# Patient Record
Sex: Female | Born: 1990 | Race: Black or African American | Hispanic: No | Marital: Single | Smoking: Never smoker
Health system: Southern US, Community
[De-identification: ages and names within clinical notes are randomized; demographics above are authoritative.]

## PROBLEM LIST (undated history)

## (undated) DIAGNOSIS — C499 Malignant neoplasm of connective and soft tissue, unspecified: Secondary | ICD-10-CM

## (undated) DIAGNOSIS — G8929 Other chronic pain: Secondary | ICD-10-CM

## (undated) DIAGNOSIS — I82401 Acute embolism and thrombosis of unspecified deep veins of right lower extremity: Secondary | ICD-10-CM

## (undated) DIAGNOSIS — M84421A Pathological fracture, right humerus, initial encounter for fracture: Secondary | ICD-10-CM

## (undated) DIAGNOSIS — C7989 Secondary malignant neoplasm of other specified sites: Secondary | ICD-10-CM

## (undated) DIAGNOSIS — G893 Neoplasm related pain (acute) (chronic): Secondary | ICD-10-CM

## (undated) DIAGNOSIS — M549 Dorsalgia, unspecified: Secondary | ICD-10-CM

## (undated) DIAGNOSIS — M84451A Pathological fracture, right femur, initial encounter for fracture: Secondary | ICD-10-CM

## (undated) DIAGNOSIS — G8191 Hemiplegia, unspecified affecting right dominant side: Secondary | ICD-10-CM

## (undated) DIAGNOSIS — M25552 Pain in left hip: Secondary | ICD-10-CM

## (undated) DIAGNOSIS — R000 Tachycardia, unspecified: Secondary | ICD-10-CM

## (undated) HISTORY — PX: HIP SURGERY: SHX245

## (undated) HISTORY — PX: PORTACATH PLACEMENT: SHX2246

## (undated) HISTORY — PX: PAIN PUMP IMPLANTATION: SHX330

---
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
## 2011-04-15 LAB — DIFFERENTIAL
Basophils Absolute: 0
Lymphs Abs: 1.5
Monocytes Relative: 3
Neutrophils Relative %: 9 — ABNORMAL LOW

## 2011-04-15 LAB — BASIC METABOLIC PANEL
BUN: 13
Calcium: 9.3
Creatinine, Ser: 0.87
Glucose, Bld: 90

## 2011-04-15 LAB — URINALYSIS, ROUTINE W REFLEX MICROSCOPIC
Hgb urine dipstick: NEGATIVE
Nitrite: NEGATIVE
Specific Gravity, Urine: 1.01
Urobilinogen, UA: 0.2

## 2011-04-15 LAB — CULTURE, BLOOD (ROUTINE X 2)
Culture: NO GROWTH
Culture: NO GROWTH
Report Status: 2272009

## 2011-04-15 LAB — CBC
HCT: 27.5 — ABNORMAL LOW
Platelets: 457 — ABNORMAL HIGH
RDW: 14.2

## 2011-04-15 LAB — URINE CULTURE

## 2011-04-19 LAB — CBC
HCT: 32.1 — ABNORMAL LOW
MCV: 101.5 — ABNORMAL HIGH
Platelets: 416 — ABNORMAL HIGH
RDW: 18.6 — ABNORMAL HIGH

## 2011-04-19 LAB — URINALYSIS, ROUTINE W REFLEX MICROSCOPIC
Glucose, UA: NEGATIVE
Hgb urine dipstick: NEGATIVE
Specific Gravity, Urine: 1.015
pH: 6

## 2011-04-19 LAB — DIFFERENTIAL
Basophils Relative: 0
Eosinophils Relative: 1
Lymphocytes Relative: 37
Neutro Abs: 4.4
Neutrophils Relative %: 59

## 2011-04-21 LAB — DIFFERENTIAL
Basophils Absolute: 0.1
Eosinophils Relative: 1
Lymphocytes Relative: 50

## 2011-04-21 LAB — COMPREHENSIVE METABOLIC PANEL
AST: 19
BUN: 9
CO2: 25
Chloride: 109
Creatinine, Ser: 0.86
Glucose, Bld: 94
Total Bilirubin: 0.6

## 2011-04-21 LAB — CBC
HCT: 33.6
Hemoglobin: 11.5
MCV: 99.4 — ABNORMAL HIGH
RBC: 3.38 — ABNORMAL LOW
WBC: 9.6

## 2011-04-25 LAB — URINALYSIS, ROUTINE W REFLEX MICROSCOPIC
Glucose, UA: NEGATIVE
Specific Gravity, Urine: 1.02

## 2011-04-25 LAB — BASIC METABOLIC PANEL
Calcium: 10.3
Chloride: 102
Creatinine, Ser: 0.87

## 2011-04-25 LAB — URINE MICROSCOPIC-ADD ON

## 2011-04-25 LAB — DIFFERENTIAL
Lymphocytes Relative: 38
Lymphs Abs: 2
Monocytes Relative: 7
Neutro Abs: 2.8
Neutrophils Relative %: 53

## 2011-04-25 LAB — CBC
MCV: 97.3
RBC: 3.7 — ABNORMAL LOW
WBC: 5.2

## 2011-05-05 LAB — DIFFERENTIAL
Basophils Absolute: 0
Eosinophils Relative: 2
Monocytes Absolute: 0.6
Monocytes Relative: 7
Neutrophils Relative %: 54

## 2011-05-05 LAB — CBC
MCV: 92.6 — ABNORMAL HIGH
Platelets: 321
WBC: 8.5

## 2011-07-11 ENCOUNTER — Ambulatory Visit (HOSPITAL_COMMUNITY)
Admission: RE | Admit: 2011-07-11 | Discharge: 2011-07-11 | Disposition: A | Discharge status: Home / Self Care | Payer: Medicaid Other | Source: Ambulatory Visit | Attending: Orthopedic Surgery | Admitting: Orthopedic Surgery

## 2011-07-11 DIAGNOSIS — IMO0001 Reserved for inherently not codable concepts without codable children: Secondary | ICD-10-CM | POA: Insufficient documentation

## 2011-07-11 DIAGNOSIS — R262 Difficulty in walking, not elsewhere classified: Secondary | ICD-10-CM | POA: Insufficient documentation

## 2011-07-11 DIAGNOSIS — M25569 Pain in unspecified knee: Secondary | ICD-10-CM | POA: Insufficient documentation

## 2011-07-11 DIAGNOSIS — R29898 Other symptoms and signs involving the musculoskeletal system: Secondary | ICD-10-CM | POA: Insufficient documentation

## 2011-07-11 DIAGNOSIS — M6281 Muscle weakness (generalized): Secondary | ICD-10-CM | POA: Insufficient documentation

## 2011-07-11 NOTE — Progress Notes (Signed)
Physical Therapy Evaluation  Patient Details  Name: Stacey Murphy MRN: [id number] Date of Birth: Jul 25, 1991  Today's Date: 07/11/2011 Time: 5621-3086 Time Calculation (min): 37 min Visit#: 1  of 8   Re-eval: 08/10/11 Assessment Diagnosis: R iliac wing mass Next MD Visit: Jan 2nd  Prior Therapy: yrs. ago.  Past Medical History: No past medical history on file. Past Surgical History: No past surgical history on file.  Subjective Symptoms/Limitations Symptoms: [name] states that she has a mass on her R hip for 8 yrs.  The mass has always bothered her but it is getting bigger and is affecting her strength therefore she has been referred to physical therapy.   How long can you sit comfortably?: The patient states that she starts having increased pain after ten to fifteen minutes. How long can you stand comfortably?: The patient states that she can stand for twenty minutes before it starts bothering her. How long can you walk comfortably?: The patient states thtat she is able to walk for an hour before she has to quit. Pain Assessment Currently in Pain?: No/denies Pain Score:  (worst pain is a 7-8, pain in knee.) Pain Location: Knee Pain Orientation: Right;Medial Pain Type: Chronic pain Pain Radiating Towards: occasionally to ankle Pain Onset: More than a month ago Pain Frequency: Intermittent Pain Relieving Factors: pain medication   Precautions/Restrictions     Prior Functioning  Home Living Lives With: Family  Sensation/Coordination/Flexibility    Assessment RLE Strength Right Hip Flexion: 2+/5 Right Hip Extension: 2-/5 Right Hip ABduction: 2-/5 Right Hip ADduction: 3/5 Right Knee Flexion: 3+/5 Right Knee Extension: 3+/5 Right Ankle Dorsiflexion: 5/5 Right Ankle Plantar Flexion: 4/5 Right Ankle Inversion: 4/5 Right Ankle Eversion: 4/5 LLE Strength Left Hip Flexion: 4/5 Left Hip Extension: 2/5 Left Hip ABduction: 3-/5 Left Hip ADduction: 5/5 Left Knee  Flexion: 5/5 Left Knee Extension: 5/5 Left Ankle Dorsiflexion: 4/5 Left Ankle Plantar Flexion: 4/5 Left Ankle Inversion: 5/5 Left Ankle Eversion: 5/5  Exercise/Treatments   Standing Heel Raises: 10 reps  R Hip ext and ab x 10 Seated Long Arc Quad: 10 reps Supine R Straight Leg Raises Limitations: knee bent to 30 degrees. Sidelying Hip ABduction: Strengthening;Left;5 reps Hip ADduction: Both;5 sets Prone  Hamstring Curl R 5 reps with 2# Hip Extension: Left;5 reps;Limitation knee flexed to 90 degrees   Physical Therapy Assessment and Plan PT Assessment and Plan Clinical Impression Statement: Pt with inoperable mass on R iliac wing with complaint of medical leg pain.  Pt will benefit from skilled PT to improve functional ability and decrease pain. Rehab Potential: Good PT Frequency: Min 2X/week PT Duration: 4 weeks PT Treatment/Interventions: Gait training;Therapeutic exercise;Balance training PT Plan: Begin lateral step up, forward step up, rockerboard, cybex leg extension, next treatment.    Goals Home Exercise Program Pt will Perform Home Exercise Program: Independently PT Short Term Goals Time to Complete Short Term Goals: 2 weeks PT Short Term Goal 1: Pt pain to be no greater than a 5 PT Short Term Goal 2: Pt to be able to stand for 30 min to make a meal. PT Short Term Goal 3: strength increased 1/2 grade to allow decreased pain. PT Long Term Goals Time to Complete Long Term Goals: 4 weeks PT Long Term Goal 1: Strength to be increased one grade to allow pain to decrease to no greater tnan a 3 PT Long Term Goal 2: Pt to be able to stand for 45 min. Long Term Goal 3: able to walk for  an hour an a half. Long Term Goal 4: able to come sit to stand with no difficulty.  Problem List Patient Active Problem List  Diagnoses  . Bilateral leg weakness  . Difficulty in walking    PT - End of Session Activity Tolerance: Patient tolerated treatment well General Behavior  During Session: [facility] for tasks performed Cognition: [facility] for tasks performed   Uva Runkel,CINDY 07/11/2011, 4:43 PM  Physician Documentation Your signature is required to indicate approval of the treatment plan as stated above.  Please sign and either send electronically or make a copy of this report for your files and return this physician signed original.   Please mark one 1.__approve of plan  2. ___approve of plan with the following conditions.   ______________________________                                                          _____________________ Physician Signature                                                                                                             Date

## 2011-07-11 NOTE — Patient Instructions (Addendum)
HEP

## 2011-07-13 ENCOUNTER — Ambulatory Visit (HOSPITAL_COMMUNITY): Payer: Self-pay | Admitting: Physical Therapy

## 2011-07-13 ENCOUNTER — Telehealth (HOSPITAL_COMMUNITY): Payer: Self-pay

## 2011-07-14 ENCOUNTER — Ambulatory Visit (HOSPITAL_COMMUNITY)
Admission: RE | Admit: 2011-07-14 | Discharge: 2011-07-14 | Disposition: A | Discharge status: Home / Self Care | Payer: Medicaid Other | Source: Ambulatory Visit | Attending: Orthopedic Surgery | Admitting: Orthopedic Surgery

## 2011-07-14 NOTE — Progress Notes (Signed)
Physical Therapy Treatment Patient Details  Name: Stacey Murphy MRN: [id number] Date of Birth: 1991-01-30  Today's Date: 07/14/2011 Time: 1020-1103 Time Calculation (min): 43 min Visit#: 2  of 8   Re-eval: 08/10/11 Charges: Therex x 39'  Subjective: Symptoms/Limitations Symptoms: Pt reports that her exercises at home are going well. Pain Assessment Currently in Pain?: No/denies Pain Score: 0-No pain   Exercise/Treatments Machines for Strengthening Cybex Leg Press: 1Pl 2x10 Standing Heel Raises: 15 reps Lateral Step Up: 10 reps;Right Forward Step Up: 10 reps Rocker Board: 2 minutes;Limitations Rocker Board Limitations: Hand assist as needed Other Standing Knee Exercises: R Hip ext and ab x 10 Seated Long Arc Quad: 10 reps Supine Straight Leg Raises: 5 reps;Limitations Straight Leg Raises Limitations: knee bent to 30 degrees. Sidelying Hip ABduction: 5 sets;Right;AAROM Hip ADduction: 10 reps;Right;Strengthening Prone  Hamstring Curl: 10 reps Hamstring Curl Limitations: 2# Hip Extension: Left;5 reps;Limitations Hip Extension Limitations: knee flexed to 45 degrees   Physical Therapy Assessment and Plan PT Assessment and Plan Clinical Impression Statement: Pt displays weakness and R LE instability with therex. Pt requires multimodal cueing to improve wt shift to R side. Pt reprots no increase in pain at end of session but reports some soreness for exercises. PT Plan: Continue to progress per PT POC.     Problem List Patient Active Problem List  Diagnoses  . Bilateral leg weakness  . Difficulty in walking    PT - End of Session Activity Tolerance: Patient tolerated treatment well General Behavior During Session: [facility] for tasks performed Cognition: [facility] for tasks performed  Antonieta Iba 07/14/2011, 11:04 AM

## 2011-07-21 ENCOUNTER — Ambulatory Visit (HOSPITAL_COMMUNITY): Payer: Self-pay | Admitting: *Deleted

## 2011-07-22 ENCOUNTER — Ambulatory Visit (HOSPITAL_COMMUNITY)
Admission: RE | Admit: 2011-07-22 | Discharge: 2011-07-22 | Disposition: A | Discharge status: Home / Self Care | Payer: Medicaid Other | Source: Ambulatory Visit | Attending: Orthopedic Surgery | Admitting: Orthopedic Surgery

## 2011-07-22 DIAGNOSIS — R29898 Other symptoms and signs involving the musculoskeletal system: Secondary | ICD-10-CM

## 2011-07-22 DIAGNOSIS — R262 Difficulty in walking, not elsewhere classified: Secondary | ICD-10-CM

## 2011-07-22 NOTE — Progress Notes (Signed)
Physical Therapy Treatment Patient Details  Name: Stacey Murphy MRN: [id number] Date of Birth: 09/07/1990  Today's Date: 07/22/2011 Time: 10:40-11:10 30 mins Charges: 30 TE Visit#: 3 of 8 Re-eval: 08/09/10    Subjective: "it's alright" re: her knee. Pt >20 mins late. Thought her appointment was at 10:30 2/10 pain R medial knee  Exercise/Treatments Machines for Strengthening Cybex Leg Press: 1Pl 1 x 10, 1.5 PL 1x10 Standing Heel Raises: 15 reps Lateral Step Up: 10 reps;Right Forward Step Up: 10 reps Rocker Board: 2 minutes;Limitations Rocker Board Limitations: Hand assist as needed Seated Long Arc Quad: 10 reps Sidelying Hip ABduction: Limitations Hip ABduction Limitations: 5 reps Hip ADduction: 10 reps;Right;Strengthening  Physical Therapy Assessment and Plan Clinical Impression statement: Patient reported some pain today with increased weight on leg press (0.5 plates) and with SLR. Pain subsided after exercise was completed.  Plan of Care: Continue with current POC, monitor for pain during exercise. May need to adjust leg press back down to one plate if she complains of pain with 1.5 plates next session.   Problem List Patient Active Problem List  Diagnoses  . Bilateral leg weakness  . Difficulty in walking   Cherie Lasalle B. Jailin Manocchio, PT, DPT  07/22/2011, 5:29 PM

## 2011-07-27 ENCOUNTER — Ambulatory Visit (HOSPITAL_COMMUNITY): Payer: Self-pay | Admitting: *Deleted

## 2011-07-28 ENCOUNTER — Ambulatory Visit (HOSPITAL_COMMUNITY): Payer: Self-pay | Admitting: *Deleted

## 2011-08-01 ENCOUNTER — Ambulatory Visit (HOSPITAL_COMMUNITY): Payer: Self-pay | Admitting: *Deleted

## 2011-08-02 ENCOUNTER — Ambulatory Visit (HOSPITAL_COMMUNITY): Payer: Self-pay

## 2011-08-03 ENCOUNTER — Ambulatory Visit (HOSPITAL_COMMUNITY): Payer: Self-pay | Admitting: *Deleted

## 2011-08-15 ENCOUNTER — Ambulatory Visit (HOSPITAL_COMMUNITY)
Admission: RE | Admit: 2011-08-15 | Discharge: 2011-08-15 | Disposition: A | Discharge status: Home / Self Care | Payer: Medicaid Other | Source: Ambulatory Visit | Attending: Orthopedic Surgery | Admitting: Orthopedic Surgery

## 2011-08-15 DIAGNOSIS — M6281 Muscle weakness (generalized): Secondary | ICD-10-CM | POA: Insufficient documentation

## 2011-08-15 DIAGNOSIS — IMO0001 Reserved for inherently not codable concepts without codable children: Secondary | ICD-10-CM | POA: Insufficient documentation

## 2011-08-15 DIAGNOSIS — M25569 Pain in unspecified knee: Secondary | ICD-10-CM | POA: Insufficient documentation

## 2011-08-15 DIAGNOSIS — R262 Difficulty in walking, not elsewhere classified: Secondary | ICD-10-CM | POA: Insufficient documentation

## 2011-08-15 DIAGNOSIS — R29898 Other symptoms and signs involving the musculoskeletal system: Secondary | ICD-10-CM

## 2011-08-15 NOTE — Progress Notes (Signed)
Physical Therapy Treatment Patient Details  Name: SHABANA ARMENTROUT MRN: [id number] Date of Birth: 1991/04/26  Today's Date: 08/15/2011 Time: 1020-1055 Time Calculation (min): 35 min Visit#: 4  of 8   Re-eval:      Subjective: Symptoms/Limitations Symptoms: Pt states she did not come last week do to back pain. States that her leg is not the main problem at this time.  Pt ambulates into the department with a significant antalgic gait.  Therapist questioned pt if she had a cane.  Pt does.  Gt training with cane showed a significant decrase in the pt limp although she states the pain is only slightly decreased by the use of the cane.  The patient verbalized that she does not want to use the cane.  The therapist explained the importance of using the cane and how walking with such a significant limp is going to increase her level of pain.  Pt very tender to palpation along scar area where pain pump was inserted.  Pt has an appointment with her MD ont the 24th.  Explained to patient that the pain pump needed to be looked at; it should not be painful.     Exercise/Treatments   Supine Bridges: 10 reps Other Supine Knee Exercises: ab set; pelvic tilt, trunk rotation, all x 10 rep. Other Supine Knee Exercises: bent knee raise x 10 with pt unable to raise right LE    Manual Therapy Manual Therapy: Massage Massage: no mm spasm palpatable but pt very tender to palpation along R lateral aspect of scar area.  Physical Therapy Assessment and Plan PT Assessment and Plan Clinical Impression Statement: Pt will need to be cleared from her MD before returning to make sure her pain pump is still intact. PT Frequency: Min 2X/week PT Duration: 4 weeks    Goals    Problem List Patient Active Problem List  Diagnoses  . Bilateral leg weakness  . Difficulty in walking    PT - End of Session Activity Tolerance: Patient limited by pain General Behavior During Session: [facility] for tasks  performed Cognition: [facility] for tasks performed  RUSSELL,CINDY 08/15/2011, 11:05 AM

## 2011-08-15 NOTE — Patient Instructions (Signed)
Instructed to use cane until she is able to ambulate without the antalgic gait.

## 2011-08-19 ENCOUNTER — Encounter (HOSPITAL_COMMUNITY): Payer: Self-pay | Admitting: *Deleted

## 2011-08-19 ENCOUNTER — Emergency Department (HOSPITAL_COMMUNITY): Payer: Medicaid Other

## 2011-08-19 ENCOUNTER — Emergency Department (HOSPITAL_COMMUNITY)
Admission: EM | Admit: 2011-08-19 | Discharge: 2011-08-19 | Disposition: A | Discharge status: Home / Self Care | Payer: Medicaid Other | Attending: Emergency Medicine | Admitting: Emergency Medicine

## 2011-08-19 DIAGNOSIS — C419 Malignant neoplasm of bone and articular cartilage, unspecified: Secondary | ICD-10-CM | POA: Insufficient documentation

## 2011-08-19 DIAGNOSIS — M8448XA Pathological fracture, other site, initial encounter for fracture: Secondary | ICD-10-CM

## 2011-08-19 DIAGNOSIS — C7952 Secondary malignant neoplasm of bone marrow: Secondary | ICD-10-CM | POA: Insufficient documentation

## 2011-08-19 DIAGNOSIS — C499 Malignant neoplasm of connective and soft tissue, unspecified: Secondary | ICD-10-CM

## 2011-08-19 DIAGNOSIS — C7989 Secondary malignant neoplasm of other specified sites: Secondary | ICD-10-CM

## 2011-08-19 DIAGNOSIS — C7800 Secondary malignant neoplasm of unspecified lung: Secondary | ICD-10-CM | POA: Insufficient documentation

## 2011-08-19 DIAGNOSIS — C7951 Secondary malignant neoplasm of bone: Secondary | ICD-10-CM | POA: Insufficient documentation

## 2011-08-19 DIAGNOSIS — W19XXXA Unspecified fall, initial encounter: Secondary | ICD-10-CM | POA: Insufficient documentation

## 2011-08-19 HISTORY — DX: Secondary malignant neoplasm of other specified sites: C79.89

## 2011-08-19 HISTORY — DX: Malignant neoplasm of connective and soft tissue, unspecified: C49.9

## 2011-08-19 LAB — URINALYSIS, ROUTINE W REFLEX MICROSCOPIC
Glucose, UA: NEGATIVE mg/dL
Leukocytes, UA: NEGATIVE
Nitrite: NEGATIVE
Protein, ur: NEGATIVE mg/dL

## 2011-08-19 LAB — PREGNANCY, URINE: Preg Test, Ur: NEGATIVE

## 2011-08-19 MED ORDER — HYDROMORPHONE HCL PF 1 MG/ML IJ SOLN
1.0000 mg | Freq: Once | INTRAMUSCULAR | Status: AC
  Administered 2011-08-19: 1 mg via INTRAMUSCULAR
  Filled 2011-08-19: qty 1

## 2011-08-19 MED ORDER — DIAZEPAM 5 MG PO TABS
5.0000 mg | ORAL_TABLET | Freq: Once | ORAL | Status: AC
  Administered 2011-08-19: 5 mg via ORAL
  Filled 2011-08-19: qty 1

## 2011-08-19 MED ORDER — ONDANSETRON HCL 4 MG PO TABS
4.0000 mg | ORAL_TABLET | Freq: Once | ORAL | Status: AC
  Administered 2011-08-19: 4 mg via ORAL
  Filled 2011-08-19: qty 1

## 2011-08-19 MED ORDER — OXYCODONE-ACETAMINOPHEN 10-325 MG PO TABS: 1.0000 | ORAL_TABLET | Freq: Four times a day (QID) | ORAL | Status: AC | PRN

## 2011-08-19 MED ORDER — OXYCODONE-ACETAMINOPHEN 5-325 MG PO TABS
1.0000 | ORAL_TABLET | Freq: Once | ORAL | Status: AC
  Administered 2011-08-19: 1 via ORAL
  Filled 2011-08-19: qty 1

## 2011-08-19 NOTE — ED Notes (Signed)
Pt fell last night, states landed on knees.  C/o low back pain, worse on right side shooting down right leg.  Pt has hx of cancer.

## 2011-08-19 NOTE — ED Provider Notes (Addendum)
2:35 PM-Diane Hanel Larena Glassman, MD to bedside to evaluate.  CC: back pain  Stacey Murphy is a 21 y.o. female who presents to the Emergency Department complaining of constant moderate to severe back pain radiating to lower extremities. 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 Currently being evaluated at [facility]. No longer on chemo or radiation at this time. Denies new lower extremity weakness or numbness. No fever or chills. No urinary or fecal incontinence. Pain worsened by movement and sitting forward. She reports she fell last night onto her bilateral knees. Denies knee pain at this time. Pain is constant. Nothing improves her pain. Her pain is moderate to severe  PMHx: osteosarcoma Meds: oxycontin. Percocet. Megace, celebrex Allergies: None Social Hx: no drug use, denies ETOH and tobacco use Family hx: noncontributory Surgical Hx: pain pump insertion  Vitals: HR 102 BP 103/50  R18 Pox 99%RA Gen: uncomfortable Head: atraumatic HEENT: eyes normal. Airway patent Cardiac: RRR Pulm: Clear bilaterally, no rhonchi Abd: soft nontender Ext: normal strength in bilateral lower and upper extremities. Sensation grossly intact in lower extremities Back: no stepoffs, no spasms. Mild tenderness of lower thoracic and lumbar spine without focal tenderness  Dg Lumbar Spine Complete  08/19/2011  *RADIOLOGY REPORT*  Clinical Data: Metastatic sarcoma, back pain, fell 2 days ago  LUMBAR SPINE - COMPLETE 4+ VIEW  Comparison: None Correlation:  CT abdomen pelvis 05/08/2010  Findings: Five non-rib bearing lumbar vertebrae. Mild broad-based levoconvex lumbar scoliosis. Infusion pump left lower quadrant obscures the spine on the RPO view. Questionable height loss at the inferior endplate of T12 on the left. Findings are suspicious for a subtle inferior endplate compression fracture. The appearance of the T12 vertebral body is different versus the prior CT from 2011. No additional  fracture, subluxation or bone destruction. Destruction of right ilium identified. No other focal bony abnormalities identified. Right pelvic calcification versus phlebolith stable.  IMPRESSION: Questionable subtle inferior endplate compression fracture at T12 on left. If clinically indicated this could be assessed by limited CT. Levoconvex scoliosis. Right iliac destruction consistent with known tumor.  Original Report Authenticated By: Lollie Marrow, M.D.   Dg Pelvis 1-2 Views  08/19/2011  *RADIOLOGY REPORT*  Clinical Data: Pain post fall, history of sarcoma  PELVIS - 1-2 VIEW  Comparison: Abdominal radiograph 08/02/2010  Findings: Large area bone destruction involving the right ileum compatible with known tumor, significantly increased since 2012. Hip joint spaces symmetric and preserved. Remaining osseous mineralization normal. No acute fracture or dislocation identified.  IMPRESSION: Large area bone destruction involving the right ileum compatible with known tumor. No definite acute bony abnormalities.  Original Report Authenticated By: Lollie Marrow, M.D.   Ct T Spine Ltd Wo Or W/ Cm  08/19/2011  *RADIOLOGY REPORT*  Clinical Data: Back pain after fall.   History of metastatic sarcoma.  CT THORACIC SPINE WITHOUT CONTRAST  Technique:  Multidetector CT imaging of the thoracic spine was performed without intravenous contrast administration. Multiplanar CT image reconstructions were also generated  Comparison: 08/19/2011 lumbar spine plain film exam.   05/25/2010 CT chest.  Findings: The present examination incorporates from mid T10 to the L1-2 disc space.  Right posterior sulcus 2.3 x 1.6 cm mass has increase in size since prior CT exam when this measured 1.3 x 1.1 cm.  New destructive lesion of the T12 vertebral body involving majority of the T12 vertebra with extension through the anterior and posterior cortex with epidural extension with  very mild impression upon the ventricular peritoneal shunt and cord.   15% loss of height of the anterior superior T12 endplate.  MR imaging would prove more sensitive for delineation of epidural disease if clinically desired.  IMPRESSION: T12 metastatic disease with epidural extension as detailed above.  Increase in size of right lung base nodule.  Critical Value/emergent results were called by telephone at the time of interpretation on 08/19/2011  at 2:25 p.m.  to  Dr. Patria Mane, who verbally acknowledged these results.  Original Report Authenticated By: Fuller Canada, M.D.    I personally reviewed the CT scan and discussed the findings with radiology  Results for orders placed during the hospital encounter of 08/19/11  URINALYSIS, ROUTINE W REFLEX MICROSCOPIC      Component Value Range   Color, Urine YELLOW  YELLOW    APPearance HAZY (*) CLEAR    Specific Gravity, Urine 1.025  1.005 - 1.030    pH 7.0  5.0 - 8.0    Glucose, UA NEGATIVE  NEGATIVE (mg/dL)   Hgb urine dipstick NEGATIVE  NEGATIVE    Bilirubin Urine NEGATIVE  NEGATIVE    Ketones, ur NEGATIVE  NEGATIVE (mg/dL)   Protein, ur NEGATIVE  NEGATIVE (mg/dL)   Urobilinogen, UA 0.2  0.0 - 1.0 (mg/dL)   Nitrite NEGATIVE  NEGATIVE    Leukocytes, UA NEGATIVE  NEGATIVE   PREGNANCY, URINE      Component Value Range   Preg Test, Ur NEGATIVE  NEGATIVE    MDM Patient informed of current lab and imaging results which show metastatic disease to T12 and associated damage to T12 vertebrae.   Patient and family member advised of need for follow up with Oncologist/PCP. Patient notes pain has not improved at this time. Will administer additional pain medication.  I personally performed the services described in this documentation, which was scribed in my presence. The recorded information has been reviewed and considered.    I discussed the findings of the CT scan with the radiologist.  Sezary on MS Contin and oxycodone.  Will add the additional oxycodone with acetaminophen.  Close followup with her oncologist.  She  really has appointment scheduled for February 6.  We have requested that the patient call for sooner followup.  She's been given a copy of her CT scan on a disk to with her to her oncology appointment  Lyanne Co, MD 08/19/11 1457  Lyanne Co, MD 08/19/11 0272  Lyanne Co, MD 08/19/11 2157

## 2011-08-19 NOTE — ED Notes (Signed)
Pt back from CT

## 2011-08-22 ENCOUNTER — Emergency Department (HOSPITAL_COMMUNITY)
Admission: EM | Admit: 2011-08-22 | Discharge: 2011-08-22 | Disposition: A | Discharge status: Other Acute Inpatient Hospital | Payer: Medicaid Other | Attending: Emergency Medicine | Admitting: Emergency Medicine

## 2011-08-22 ENCOUNTER — Encounter (HOSPITAL_COMMUNITY): Payer: Self-pay

## 2011-08-22 DIAGNOSIS — M549 Dorsalgia, unspecified: Secondary | ICD-10-CM | POA: Insufficient documentation

## 2011-08-22 DIAGNOSIS — C499 Malignant neoplasm of connective and soft tissue, unspecified: Secondary | ICD-10-CM | POA: Insufficient documentation

## 2011-08-22 DIAGNOSIS — C7951 Secondary malignant neoplasm of bone: Secondary | ICD-10-CM

## 2011-08-22 MED ORDER — SODIUM CHLORIDE 0.9 % IV SOLN
INTRAVENOUS | Status: DC
  Administered 2011-08-22: 16:00:00 via INTRAVENOUS

## 2011-08-22 MED ORDER — FENTANYL CITRATE 0.05 MG/ML IJ SOLN
100.0000 ug | INTRAMUSCULAR | Status: DC | PRN
  Administered 2011-08-22 (×2): 100 ug via INTRAVENOUS
  Filled 2011-08-22 (×2): qty 2

## 2011-08-22 NOTE — ED Notes (Signed)
Pt c/o left lower back pain since Friday.  Reports her oncologist told her to come to ed for evaluation and possible transfer to [facility].  Says doctors name is Dr. Christin Bach.

## 2011-08-22 NOTE — ED Provider Notes (Cosign Needed)
History     CSN: [id number]  Arrival date & time 08/22/11  1149   First MD Initiated Contact with Patient 08/22/11 1502      Chief Complaint  Patient presents with  . Back Pain    (Consider location/radiation/quality/duration/timing/severity/associated sxs/prior treatment) HPI Stacey Murphy is a 21 y.o. female presents with c/o back pain leading to desire to be assessed in the ED. The sx(s) have been present for several weeks. Additional concerns are the pain is worsening and not responding to her pain medicines. Causative factors are metastatic sarcoma. Palliative factors are nothing helps. The distress associated is moderate. The disorder has been present for several weeks. She was seen here 3 days ago had a CT of the lumbar spine that showed a new T12 lesion, consistent with metastases. There is no significant epidural compromise seen on the CT. The patient has no symptoms of cauda equina syndrome. Her pain is right lumbar extending to right. She denies urinary incontinence, urinary retention, fecal incontinence, and fecal retention. She has no paresthesia.     Past Medical History  Diagnosis Date  . Metastatic sarcoma     Past Surgical History  Procedure Date  . Portacath placement   . Pain pump implantation     No family history on file.  History  Substance Use Topics  . Smoking status: Never Smoker   . Smokeless tobacco: Not on file  . Alcohol Use: No    OB History    Grav Para Term Preterm Abortions TAB SAB Ect Mult Living                  Review of Systems  All other systems reviewed and are negative.    Allergies  Pork-derived products  Home Medications   Current Outpatient Rx  Name Route Sig Dispense Refill  . ALBUTEROL SULFATE HFA 108 (90 BASE) MCG/ACT IN AERS Inhalation Inhale 2 puffs into the lungs every 6 (six) hours as needed. For shortness of breath/cough    . CELECOXIB 100 MG PO CAPS Oral Take 100 mg by mouth daily.    . MEGESTROL  ACETATE 40 MG/ML PO SUSP Oral Take 200 mg by mouth 2 (two) times daily.     . MUSCLE RUB 10-15 % EX CREA Topical Apply 1 application topically as needed. For muscle pain reilef    . OXYCODONE HCL 5 MG PO TABS Oral Take 5 mg by mouth every 6 (six) hours as needed. pain    . OXYCODONE-ACETAMINOPHEN 10-325 MG PO TABS Oral Take 1 tablet by mouth every 6 (six) hours as needed for pain. 30 tablet 0    BP 128/63  Pulse 122  Temp(Src) 98.6 F (37 C) (Oral)  Resp 16  Ht 4\' 7"  (1.397 m)  Wt 106 lb (48.081 kg)  BMI 24.64 kg/m2  SpO2 100%  Physical Exam  Nursing note and vitals reviewed. Constitutional: She is oriented to person, place, and time. She appears well-developed and well-nourished.  HENT:  Head: Normocephalic and atraumatic.  Eyes: Conjunctivae and EOM are normal. Pupils are equal, round, and reactive to light.  Neck: Normal range of motion and phonation normal. Neck supple.  Cardiovascular: Normal rate, regular rhythm and intact distal pulses.   Pulmonary/Chest: Effort normal and breath sounds normal. She exhibits no tenderness.  Abdominal: Soft. She exhibits no distension. There is no tenderness. There is no guarding.  Musculoskeletal: She exhibits no edema.       She has moderate right  Lumbar  tenderness; and decreased range of motion of the back secondary to pain at the site.  Neurological: She is alert and oriented to person, place, and time. She has normal strength. No cranial nerve deficit. She exhibits normal muscle tone. Coordination normal.  Skin: Skin is warm and dry.  Psychiatric: She has a normal mood and affect. Her behavior is normal. Judgment and thought content normal.    ED Course  Procedures (including critical care time)  15:35- Case Discussed with Dr Hipps at [facility]; he accepts her for transfer there.   IV fluids, and IV analgesia, ordered.    Labs Reviewed - No data to display No results found.   1. 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 Pt needs admission for pain control and possibly advanced oncoologic intervention. Will transfer to her Oncologist in [place].        Flint Melter, MD 08/24/11 [id number]

## 2011-08-22 NOTE — ED Notes (Signed)
Still have not received a return call for report

## 2011-08-22 NOTE — ED Notes (Signed)
[facility] just left with pt to [facility], [facility] called for report and nurse unable to take report, was told that they would call back, awaiting for return call to give report

## 2011-09-07 NOTE — ED Provider Notes (Signed)
History     CSN: [id number]  Arrival date & time 08/19/11  4098   First MD Initiated Contact with Patient 08/19/11 [phone]      Chief Complaint  Patient presents with  . Back Pain    (Consider location/radiation/quality/duration/timing/severity/associated sxs/prior treatment) Patient is a 21 y.o. female presenting with back pain. The history is provided by the patient.  Back Pain  This is a new problem. The current episode started yesterday. The problem occurs constantly. The problem has been gradually worsening. The pain is associated with falling. The pain is present in the lumbar spine. The quality of the pain is described as aching. The pain radiates to the right thigh. The pain is severe. The symptoms are aggravated by certain positions. The pain is the same all the time. Pertinent negatives include no chest pain, no numbness, no abdominal pain, no bowel incontinence, no perianal numbness, no bladder incontinence and no dysuria. Treatments tried: Her own medicines. The treatment provided mild relief. Risk factors include a history of cancer.    Past Medical History  Diagnosis Date  . Metastatic sarcoma     Past Surgical History  Procedure Date  . Portacath placement   . Pain pump implantation     No family history on file.  History  Substance Use Topics  . Smoking status: Never Smoker   . Smokeless tobacco: Not on file  . Alcohol Use: No    OB History    Grav Para Term Preterm Abortions TAB SAB Ect Mult Living                  Review of Systems  Constitutional: Negative for activity change.       All ROS Neg except as noted in HPI  HENT: Negative for nosebleeds and neck pain.   Eyes: Negative for photophobia and discharge.  Respiratory: Negative for cough, shortness of breath and wheezing.   Cardiovascular: Negative for chest pain and palpitations.  Gastrointestinal: Negative for abdominal pain, blood in stool and bowel incontinence.  Genitourinary: Negative  for bladder incontinence, dysuria, frequency and hematuria.  Musculoskeletal: Positive for back pain and joint swelling. Negative for arthralgias.  Skin: Negative.   Neurological: Negative for dizziness, seizures, speech difficulty and numbness.  Psychiatric/Behavioral: Negative for hallucinations and confusion. The patient is nervous/anxious.     Allergies  Pork-derived products  Home Medications   Current Outpatient Rx  Name Route Sig Dispense Refill  . CELECOXIB 100 MG PO CAPS Oral Take 100 mg by mouth daily.    . MEGESTROL ACETATE 40 MG/ML PO SUSP Oral Take 200 mg by mouth 2 (two) times daily.     . OXYCODONE HCL 5 MG PO TABS Oral Take 5 mg by mouth every 6 (six) hours as needed. pain    . ALBUTEROL SULFATE HFA 108 (90 BASE) MCG/ACT IN AERS Inhalation Inhale 2 puffs into the lungs every 6 (six) hours as needed. For shortness of breath/cough    . MUSCLE RUB 10-15 % EX CREA Topical Apply 1 application topically as needed. For muscle pain reilef      BP 103/50  Pulse 102  Temp(Src) 97.7 F (36.5 C) (Oral)  Resp 18  Ht 4\' 7"  (1.397 m)  Wt 106 lb (48.081 kg)  BMI 24.64 kg/m2  SpO2 99%  Physical Exam  Nursing note and vitals reviewed. Constitutional: She is oriented to person, place, and time. She appears well-developed and well-nourished.  Non-toxic appearance.  HENT:  Head: Normocephalic.  Right  Ear: Tympanic membrane and external ear normal.  Left Ear: Tympanic membrane and external ear normal.  Eyes: EOM and lids are normal. Pupils are equal, round, and reactive to light.  Neck: Normal range of motion. Neck supple. Carotid bruit is not present.  Cardiovascular: Normal rate, regular rhythm, normal heart sounds, intact distal pulses and normal pulses.   Pulmonary/Chest: Breath sounds normal. No respiratory distress.  Abdominal: Soft. Bowel sounds are normal. There is no tenderness. There is no guarding.  Musculoskeletal: Normal range of motion.       Pain of the lower  thoracic and lumbar area to palpation into attempted range of motion. No palpable deformity appreciated at this time. There is pain to palpation with mild to moderate swelling of the right knee. There is pain with attempted range of motion. There is no deformity of the right hip there is no shortening of the right leg. There are mild abrasions noted.  Lymphadenopathy:       Head (right side): No submandibular adenopathy present.       Head (left side): No submandibular adenopathy present.    She has no cervical adenopathy.  Neurological: She is alert and oriented to person, place, and time. She has normal strength. No cranial nerve deficit or sensory deficit.  Skin: Skin is warm and dry.  Psychiatric: She has a normal mood and affect. Her speech is normal.    ED Course  Procedures (including critical care time)  Labs Reviewed  URINALYSIS, ROUTINE W REFLEX MICROSCOPIC - Abnormal; Notable for the following:    APPearance HAZY (*)    All other components within normal limits  PREGNANCY, URINE  LAB REPORT - SCANNED   No results found.   1. Pathologic fracture of vertebra   2. Metastatic sarcoma       MDM  I have reviewed nursing notes, vital signs, and all appropriate lab and imaging results for this patient.  Results for orders placed during the hospital encounter of 08/19/11  URINALYSIS, ROUTINE W REFLEX MICROSCOPIC      Component Value Range   Color, Urine YELLOW  YELLOW    APPearance HAZY (*) CLEAR    Specific Gravity, Urine 1.025  1.005 - 1.030    pH 7.0  5.0 - 8.0    Glucose, UA NEGATIVE  NEGATIVE (mg/dL)   Hgb urine dipstick NEGATIVE  NEGATIVE    Bilirubin Urine NEGATIVE  NEGATIVE    Ketones, ur NEGATIVE  NEGATIVE (mg/dL)   Protein, ur NEGATIVE  NEGATIVE (mg/dL)   Urobilinogen, UA 0.2  0.0 - 1.0 (mg/dL)   Nitrite NEGATIVE  NEGATIVE    Leukocytes, UA NEGATIVE  NEGATIVE   PREGNANCY, URINE      Component Value Range   Preg Test, Ur NEGATIVE  NEGATIVE    Dg Lumbar  Spine Complete  08/19/2011  *RADIOLOGY REPORT*  Clinical Data: Metastatic sarcoma, back pain, fell 2 days ago  LUMBAR SPINE - COMPLETE 4+ VIEW  Comparison: None Correlation:  CT abdomen pelvis 05/08/2010  Findings: Five non-rib bearing lumbar vertebrae. Mild broad-based levoconvex lumbar scoliosis. Infusion pump left lower quadrant obscures the spine on the RPO view. Questionable height loss at the inferior endplate of T12 on the left. Findings are suspicious for a subtle inferior endplate compression fracture. The appearance of the T12 vertebral body is different versus the prior CT from 2011. No additional fracture, subluxation or bone destruction. Destruction of right ilium identified. No other focal bony abnormalities identified. Right pelvic calcification versus phlebolith  stable.  IMPRESSION: Questionable subtle inferior endplate compression fracture at T12 on left. If clinically indicated this could be assessed by limited CT. Levoconvex scoliosis. Right iliac destruction consistent with known tumor.  Original Report Authenticated By: Lollie Marrow, M.D.   Dg Pelvis 1-2 Views  08/19/2011  *RADIOLOGY REPORT*  Clinical Data: Pain post fall, history of sarcoma  PELVIS - 1-2 VIEW  Comparison: Abdominal radiograph 08/02/2010  Findings: Large area bone destruction involving the right ileum compatible with known tumor, significantly increased since 2012. Hip joint spaces symmetric and preserved. Remaining osseous mineralization normal. No acute fracture or dislocation identified.  IMPRESSION: Large area bone destruction involving the right ileum compatible with known tumor. No definite acute bony abnormalities.  Original Report Authenticated By: Lollie Marrow, M.D.   Ct T Spine Ltd Wo Or W/ Cm  08/19/2011  *RADIOLOGY REPORT*  Clinical Data: Back pain after fall.   History of metastatic sarcoma.  CT THORACIC SPINE WITHOUT CONTRAST  Technique:  Multidetector CT imaging of the thoracic spine was performed without  intravenous contrast administration. Multiplanar CT image reconstructions were also generated  Comparison: 08/19/2011 lumbar spine plain film exam.   05/25/2010 CT chest.  Findings: The present examination incorporates from mid T10 to the L1-2 disc space.  Right posterior sulcus 2.3 x 1.6 cm mass has increase in size since prior CT exam when this measured 1.3 x 1.1 cm.  New destructive lesion of the T12 vertebral body involving majority of the T12 vertebra with extension through the anterior and posterior cortex with epidural extension with very mild impression upon the ventricular peritoneal shunt and cord.  15% loss of height of the anterior superior T12 endplate.  MR imaging would prove more sensitive for delineation of epidural disease if clinically desired.  IMPRESSION: T12 metastatic disease with epidural extension as detailed above.  Increase in size of right lung base nodule.  Critical Value/emergent results were called by telephone at the time of interpretation on 08/19/2011  at 2:25 p.m.  to  Dr. Patria Mane, who verbally acknowledged these results.  Original Report Authenticated By: Fuller Canada, M.D.         Kathie Dike, [place] 09/07/11 2142

## 2011-09-08 NOTE — ED Provider Notes (Signed)
Medical screening examination/treatment/procedure(s) were performed by non-physician practitioner and as supervising physician I was immediately available for consultation/collaboration.   Lyanne Co, MD 09/08/11 [phone]

## 2012-01-06 IMAGING — CR DG CHEST 1V
1 series · 1 of 1 positions shown · non-contrast
Comparison: 12/09/2009

CLINICAL DATA: Shortness of breath.  Metastatic cancer.

CHEST - 1 VIEW

[view not recorded]
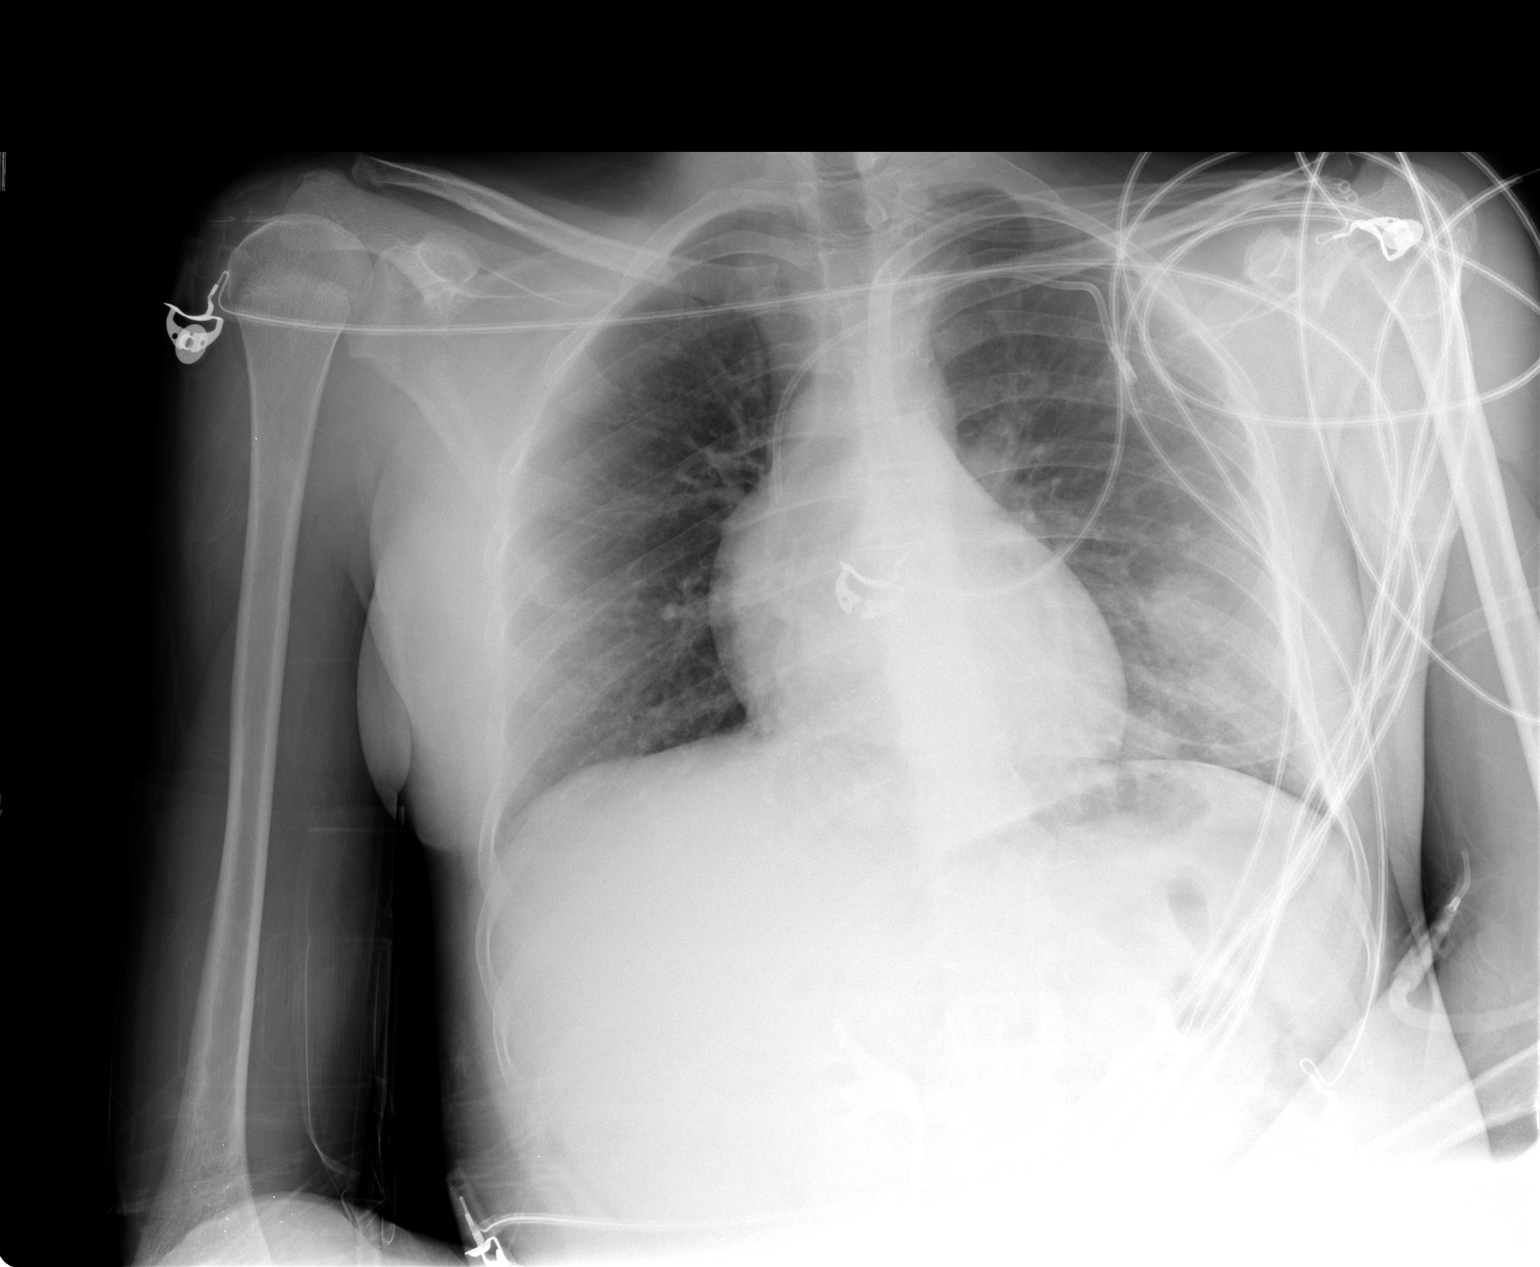

[1 of 1 positions shown; findings below may reference images not displayed]

FINDINGS: The multiple pulmonary nodules appear essentially
unchanged.  There are no acute infiltrates or effusions.  Heart
size and vascularity are normal. Port-A-Cath is in place.  No
significant bony abnormality.
IMPRESSION: Diffuse metastatic disease, essentially unchanged.

## 2012-01-06 IMAGING — CT CT ANGIO CHEST
1 of 6 series · 5 of 36 positions shown · IV contrast (Omnipaque 300)
Comparison: 10/04/2009

CLINICAL DATA: Shortness of breath and respiratory distress.  Left-
sided chest pain.  Metastatic lung cancer.

CT ANGIOGRAPHY CHEST WITH CONTRAST
TECHNIQUE: Multidetector CT imaging of the chest was performed
using the standard protocol during bolus administration of
intravenous contrast.  Multiplanar CT image reconstructions
including MIPs were obtained to evaluate the vascular anatomy.
Contrast:  80 ml [id number]

[Series 4: pe 3.0 b40f · axial · 0.48mm/px · z∈[-168,-16]mm · 5 of 77 slices shown]
[im 13/77  lung]
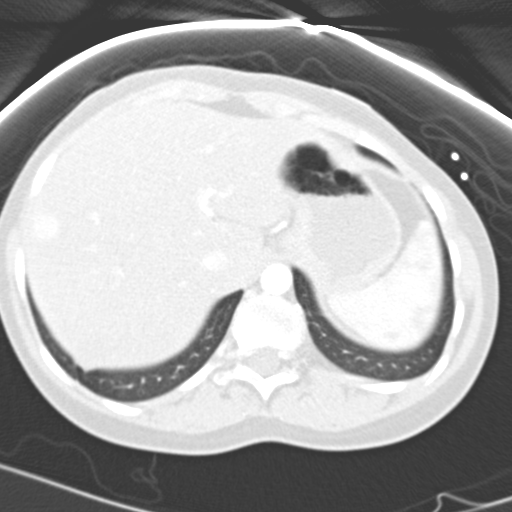
[im 26/77  mediastinal]
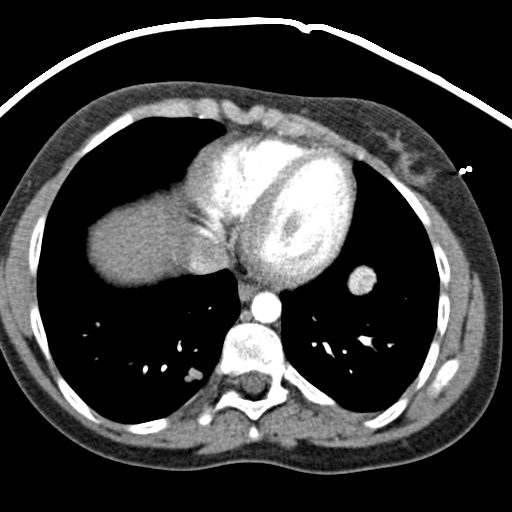
[im 39/77  lung]
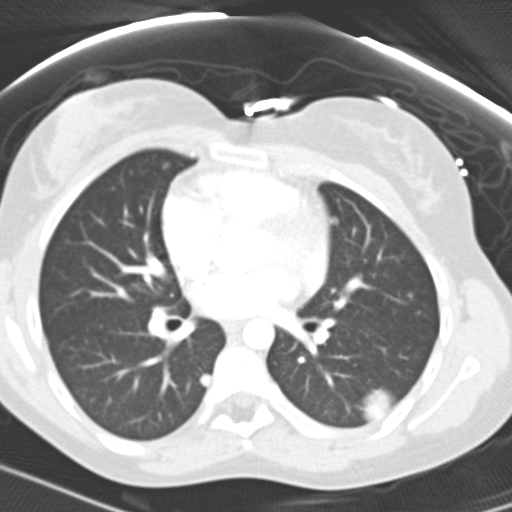
[im 51/77  mediastinal]
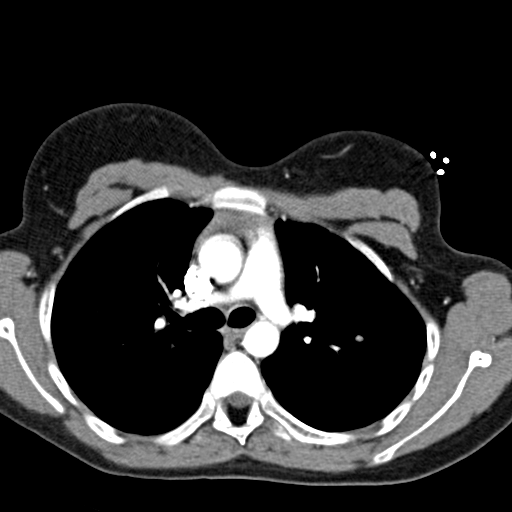
[im 64/77  lung]
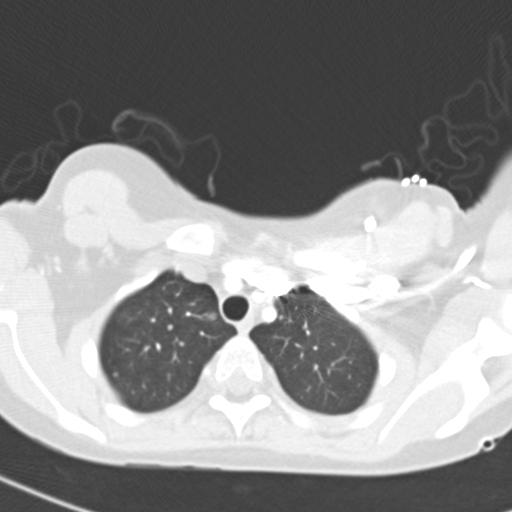

[5 of 36 positions shown; findings below may reference images not displayed]

FINDINGS: There are no discrete pulmonary emboli.  There are
numerous metastatic lesions throughout both lungs.  The largest
lesion measures 2.3 cm in diameter and is adjacent to the posterior
pleural surface of the left lung.

There is a metastatic lesion in the lateral aspect of the right
lobe of the liver which has increased in size, measuring 14 mm in
diameter, increased from 8 mm on 10/04/2009.  There is several
other tiny areas of enhancement within the liver parenchyma which
could represent small metastatic lesions as well.

No discrete bony abnormality.  No effusions.

Review of the MIP images confirms the above findings.
IMPRESSION: 1.  No evidence pulmonary emboli.
2.  Numerous metastatic nodules throughout both lungs, essentially
unchanged since the prior study of 10/04/2009.
3.  Focal hypervascular metastasis in the lateral aspect of the
right lobe of the liver, increased in size since the prior study.

## 2012-04-03 ENCOUNTER — Ambulatory Visit (HOSPITAL_COMMUNITY)
Admission: RE | Admit: 2012-04-03 | Discharge: 2012-04-03 | Disposition: A | Discharge status: Home / Self Care | Payer: Medicaid Other | Source: Ambulatory Visit | Attending: Pediatric Hematology and Oncology | Admitting: Pediatric Hematology and Oncology

## 2012-04-03 DIAGNOSIS — M6281 Muscle weakness (generalized): Secondary | ICD-10-CM | POA: Insufficient documentation

## 2012-04-03 DIAGNOSIS — IMO0001 Reserved for inherently not codable concepts without codable children: Secondary | ICD-10-CM | POA: Insufficient documentation

## 2012-04-03 DIAGNOSIS — R262 Difficulty in walking, not elsewhere classified: Secondary | ICD-10-CM | POA: Insufficient documentation

## 2012-04-03 NOTE — Evaluation (Signed)
Physical Therapy Evaluation  Patient Details  Name: Stacey Murphy MRN: [id number] Date of Birth: June 27, 1991  Today's Date: 04/03/2012 Time:  -     Visit#:   of    Re-eval:      Authorization:    Authorization Time Period:    Authorization Visit#:   of     Past Medical History:  Past Medical History  Diagnosis Date  . Metastatic sarcoma    Past Surgical History:  Past Surgical History  Procedure Date  . Portacath placement   . Pain pump implantation      Precautions/Restrictions  INITIAL EVALUATION  Physical Therapy   Patient Name: Stacey Murphy Date Of Birth: 1990/09/26 Guardian Name: N/A Treatment ICD-9 Code: 7197 Address: [address] Date of Evaluation: 04/03/2012 [place], [place] [address] Requested Dates of Service: 04/03/2012 - 04/03/2012 Therapy History: *This provider has provided therapy for this problem and discharged patient Reason For Referral: Recipient has an ongoing injury, disease or condition Prior Level of Function: Independent/Modified Independent with all ADLs (OT/PT) or Audition, Communication, Voice and/or Swallowing Skills (ST/AUD) Additional Medical History: Stacey Murphy was diagnosed with cancer ten years ago. She underwent radiation, chemo and is now on an investigational drug. She is doing a home exercise program every day with her mother but has become increasingly weak. The patient began using a rolling seated walker in July and is currently having significant difficuly getting up and down her steps to get into her house. She has four steps to get into her house. She currently is getting up and down her house with a ramp that her Kateri Mc built that is falling apart. She is unable to go down steps safely at this time on her own as it takes too much energy from her. The patient's strength was tested and a new HEP was given to her as well as a fact sheet on how to build a ramp. Prematurity: N/A Severity Level: N/A Treatment Goals:  1. Goal: I in HEP that  targets current weakned mm  Baseline: AT 5/5; Gastroc 3/5; hip flex 2- L 3; Hip ext 2- B; hip ab R 2-; L 2;  Duration: 1 Week(s)  2. Goal: Pt to have information to aquire a ramp  Baseline: no information  Duration: 1 Week(s)  Treatment Frequency/Duration: 1x/week for 1 weeks  Units per visit: N/A    Assessment:  Pt is unable to get up and down her front steps safely.  Her current wheelchair ramp is falling apart and is not safe.  This patient would benefit from a wheelchair ramp.    Physical Therapy Assessment and Plan    One time treatment.  Pt was given a HEP based on her current mm strength.    Goals  I HEP; wheelchair ramp built for pt  Problem List Patient Active Problem List  Diagnosis  . Bilateral leg weakness  . Difficulty in walking       GP    RUSSELL,CINDY 04/03/2012, 3:07 PM  Physician Documentation Your signature is required to indicate approval of the treatment plan as stated above.  Please sign and either send electronically or make a copy of this report for your files and return this physician signed original.   Please mark one 1.__approve of plan  2. ___approve of plan with the following conditions.   ______________________________  _____________________ Physician Signature                                                                                                             Date

## 2012-05-04 ENCOUNTER — Encounter (HOSPITAL_COMMUNITY): Payer: Self-pay | Admitting: *Deleted

## 2012-05-04 ENCOUNTER — Emergency Department (HOSPITAL_COMMUNITY)
Admission: EM | Admit: 2012-05-04 | Discharge: 2012-05-04 | Disposition: A | Discharge status: Home / Self Care | Payer: Medicaid Other | Attending: Emergency Medicine | Admitting: Emergency Medicine

## 2012-05-04 DIAGNOSIS — L509 Urticaria, unspecified: Secondary | ICD-10-CM | POA: Insufficient documentation

## 2012-05-04 DIAGNOSIS — C155 Malignant neoplasm of lower third of esophagus: Secondary | ICD-10-CM | POA: Insufficient documentation

## 2012-05-04 MED ORDER — EPINEPHRINE HCL 0.1 MG/ML IJ SOLN
0.3000 mg | Freq: Once | INTRAMUSCULAR | Status: AC
  Filled 2012-05-04: qty 10

## 2012-05-04 MED ORDER — FAMOTIDINE 20 MG PO TABS
ORAL_TABLET | ORAL | Status: AC
  Filled 2012-05-04: qty 1

## 2012-05-04 MED ORDER — FAMOTIDINE IN NACL 20-0.9 MG/50ML-% IV SOLN
20.0000 mg | Freq: Once | INTRAVENOUS | Status: AC
  Administered 2012-05-04: 20 mg via INTRAVENOUS
  Filled 2012-05-04: qty 50

## 2012-05-04 MED ORDER — PREDNISONE 10 MG PO TABS: ORAL_TABLET | ORAL | Status: DC

## 2012-05-04 MED ORDER — DIPHENHYDRAMINE HCL 25 MG PO CAPS: 25.0000 mg | ORAL_CAPSULE | Freq: Four times a day (QID) | ORAL | Status: DC | PRN

## 2012-05-04 MED ORDER — PREDNISONE 20 MG PO TABS
40.0000 mg | ORAL_TABLET | Freq: Once | ORAL | Status: AC
  Administered 2012-05-04: 40 mg via ORAL
  Filled 2012-05-04: qty 2

## 2012-05-04 MED ORDER — EPINEPHRINE HCL 1 MG/ML IJ SOLN
INTRAMUSCULAR | Status: AC
  Administered 2012-05-04: 0.3 mg
  Filled 2012-05-04: qty 1

## 2012-05-04 MED ORDER — DIPHENHYDRAMINE HCL 25 MG PO CAPS
25.0000 mg | ORAL_CAPSULE | Freq: Once | ORAL | Status: AC
  Administered 2012-05-04: 25 mg via ORAL
  Filled 2012-05-04: qty 1

## 2012-05-04 NOTE — ED Provider Notes (Cosign Needed)
History     CSN: [id number]  Arrival date & time 05/04/12  2004   First MD Initiated Contact with Patient 05/04/12 2016      Chief Complaint  Patient presents with  . Urticaria    (Consider location/radiation/quality/duration/timing/severity/associated sxs/prior treatment) Patient is a 21 y.o. female presenting with urticaria. The history is provided by the patient. No language interpreter was used.  Urticaria This is a new problem. The current episode started yesterday (Pt had onset of urticaria on her left arm yesterday.  She has had hives coming and going since then.  The hives itch a lot.). The problem occurs constantly. The problem has been gradually worsening. Pertinent negatives include no shortness of breath. Nothing aggravates the symptoms. Nothing relieves the symptoms. She has tried nothing for the symptoms.    Past Medical History  Diagnosis Date  . Metastatic sarcoma     Past Surgical History  Procedure Date  . Portacath placement   . Pain pump implantation     History reviewed. No pertinent family history.  History  Substance Use Topics  . Smoking status: Never Smoker   . Smokeless tobacco: Not on file  . Alcohol Use: No    OB History    Grav Para Term Preterm Abortions TAB SAB Ect Mult Living                  Review of Systems  Constitutional: Negative for fever and chills.  HENT: Negative.   Eyes: Negative.   Respiratory: Negative.  Negative for chest tightness and shortness of breath.   Cardiovascular: Negative.   Gastrointestinal: Negative.   Genitourinary: Negative.   Musculoskeletal: Negative.   Skin: Positive for rash.  Neurological: Negative.   Psychiatric/Behavioral: Negative.     Allergies  Pork-derived products  Home Medications   Current Outpatient Rx  Name Route Sig Dispense Refill  . ALBUTEROL SULFATE HFA 108 (90 BASE) MCG/ACT IN AERS Inhalation Inhale 2 puffs into the lungs every 6 (six) hours as needed. For shortness of  breath/cough    . CELECOXIB 100 MG PO CAPS Oral Take 100 mg by mouth daily.    . MEGESTROL ACETATE 40 MG/ML PO SUSP Oral Take 200 mg by mouth 2 (two) times daily.     . MUSCLE RUB 10-15 % EX CREA Topical Apply 1 application topically as needed. For muscle pain reilef    . OXYCODONE HCL 5 MG PO TABS Oral Take 5 mg by mouth every 6 (six) hours as needed. pain      BP 117/57  Pulse 86  Temp 98.8 F (37.1 C) (Oral)  Resp 25  Ht 4\' 7"  (1.397 m)  Wt 110 lb (49.896 kg)  BMI 25.57 kg/m2  SpO2 100%  Physical Exam  Nursing note and vitals reviewed. Constitutional: She is oriented to person, place, and time.       Small young woman, state weight 110 lbs, with itching.  HENT:  Head: Normocephalic and atraumatic.  Right Ear: External ear normal.  Left Ear: External ear normal.  Mouth/Throat: Oropharynx is clear and moist.  Eyes: Conjunctivae normal and EOM are normal. Pupils are equal, round, and reactive to light. Left eye exhibits no discharge.  Neck: Normal range of motion. Neck supple.  Cardiovascular: Normal rate, regular rhythm and normal heart sounds.   Pulmonary/Chest: Effort normal and breath sounds normal.  Abdominal: Soft. Bowel sounds are normal.  Musculoskeletal: Normal range of motion.  Neurological: She is alert and oriented to person, place,  and time.       No sensory or motor deficit.  Skin:       She has an urticarial rash with plaques of urticaria of varying size, up to 5 cm diameter, some fading, some enlarging.  Psychiatric: She has a normal mood and affect. Her behavior is normal.    ED Course  Procedures (including critical care time)   8:32 PM Pt was seen and physical exam was performed.  Epinephrine, Benadryl, Pepcid and prednisone were ordered.  10:15 PM Hives have resolved.  Rx Prednisone 20 mg qd x 5 days, and Benadryl 25 mg q4h prn itching.  1. Urticaria        Carleene Cooper III, MD 05/04/12 2215

## 2012-05-04 NOTE — ED Notes (Signed)
Onset yesterday with hives,  No resp distress. Alert,

## 2012-05-04 NOTE — ED Notes (Signed)
Pt presents with hives noted on all 4 extremities and torso x 24 hrs. Pt denies starting new medication or food allergies. Pt denies SOB, fever, n/v at this time. NAD noted at this time.

## 2012-05-04 NOTE — ED Notes (Signed)
Pt c/o chest discomfort and headache after epi injection. RN stayed with pt. HR 109. No SOB noted or reported. EDP notified.

## 2012-10-24 ENCOUNTER — Emergency Department (HOSPITAL_COMMUNITY)
Admission: EM | Admit: 2012-10-24 | Discharge: 2012-10-25 | Disposition: A | Discharge status: Home / Self Care | Payer: Medicaid Other | Attending: Emergency Medicine | Admitting: Emergency Medicine

## 2012-10-24 ENCOUNTER — Encounter (HOSPITAL_COMMUNITY): Payer: Self-pay | Admitting: *Deleted

## 2012-10-24 DIAGNOSIS — Z8589 Personal history of malignant neoplasm of other organs and systems: Secondary | ICD-10-CM | POA: Insufficient documentation

## 2012-10-24 DIAGNOSIS — Z79899 Other long term (current) drug therapy: Secondary | ICD-10-CM | POA: Insufficient documentation

## 2012-10-24 DIAGNOSIS — D696 Thrombocytopenia, unspecified: Secondary | ICD-10-CM | POA: Insufficient documentation

## 2012-10-24 DIAGNOSIS — R040 Epistaxis: Secondary | ICD-10-CM | POA: Insufficient documentation

## 2012-10-24 LAB — CBC WITH DIFFERENTIAL/PLATELET
Basophils Relative: 0 % (ref 0–1)
Eosinophils Absolute: 0 10*3/uL (ref 0.0–0.7)
HCT: 29.9 % — ABNORMAL LOW (ref 36.0–46.0)
Hemoglobin: 10.3 g/dL — ABNORMAL LOW (ref 12.0–15.0)
MCH: 31.7 pg (ref 26.0–34.0)
MCHC: 34.4 g/dL (ref 30.0–36.0)
MCV: 92 fL (ref 78.0–100.0)
Monocytes Absolute: 0.1 10*3/uL (ref 0.1–1.0)
Monocytes Relative: 2 % — ABNORMAL LOW (ref 3–12)
Neutro Abs: 3.6 10*3/uL (ref 1.7–7.7)

## 2012-10-24 MED ORDER — OXYMETAZOLINE HCL 0.05 % NA SOLN
1.0000 | Freq: Once | NASAL | Status: AC
  Administered 2012-10-24: 1 via NASAL

## 2012-10-24 MED ORDER — OXYMETAZOLINE HCL 0.05 % NA SOLN
NASAL | Status: AC
  Filled 2012-10-24: qty 15

## 2012-10-24 MED ORDER — SILVER NITRATE-POT NITRATE 75-25 % EX MISC
CUTANEOUS | Status: AC
  Administered 2012-10-25
  Filled 2012-10-24: qty 1

## 2012-10-24 NOTE — ED Notes (Signed)
Pt presents with spontaneous bloody nose x 1 hr before arriving to ED.  Bleeding under control at this time. Pt reports is nausea at this time secondary to blood in back of throat causing her to gag. NAD noted at this time. Pt denies blood thinners, Asprin use at this time.

## 2012-10-24 NOTE — ED Provider Notes (Signed)
History  This chart was scribed for Glynn Octave, MD by Shari Heritage, ED Scribe. The patient was seen in room APA08/APA08. Patient's care was started at 2234.   CSN: [id number]  Arrival date & time 10/24/12  2150   First MD Initiated Contact with Patient 10/24/12 2234      Chief Complaint  Patient presents with  . Epistaxis     The history is provided by the patient. No language interpreter was used.    HPI Comments: Stacey Murphy is a 22 y.o. female with history of metastatic sarcoma who presents to the Emergency Department complaining of episodic, left sided epistaxis that began yesterday. Patient says that she had an episode of 1-2 hour episode of mild epistaxis last night which resolved. She had another episode this evening that began at 8:53 pm and is gradually improving. She has no prior history of nosebleeds. Patient is not in active treatment for cancer. She denies any other symptoms at this time. She does not smoke or use alcohol.   Past Medical History  Diagnosis Date  . Metastatic sarcoma     Past Surgical History  Procedure Laterality Date  . Portacath placement    . Pain pump implantation      History reviewed. No pertinent family history.  History  Substance Use Topics  . Smoking status: Never Smoker   . Smokeless tobacco: Not on file  . Alcohol Use: No    OB History   Grav Para Term Preterm Abortions TAB SAB Ect Mult Living                  Review of Systems A complete 10 system review of systems was obtained and all systems are negative except as noted in the HPI and PMH.   Allergies  Pork-derived products  Home Medications   Current Outpatient Rx  Name  Route  Sig  Dispense  Refill  . celecoxib (CELEBREX) 100 MG capsule   Oral   Take 100 mg by mouth 2 (two) times daily.          Marland Kitchen HYDROmorphone (DILAUDID) 8 MG tablet   Oral   Take 8 mg by mouth 2 (two) times daily.         . Menthol-Methyl Salicylate (MUSCLE RUB) 10-15 % CREA    Topical   Apply 1 application topically as needed. For muscle pain reilef         . morphine (MS CONTIN) 30 MG 12 hr tablet   Oral   Take 30 mg by mouth 2 (two) times daily.         . Oxycodone HCl 10 MG TABS   Oral   Take 10 mg by mouth 4 (four) times daily.          Marland Kitchen albuterol (VENTOLIN HFA) 108 (90 BASE) MCG/ACT inhaler   Inhalation   Inhale 2 puffs into the lungs every 6 (six) hours as needed. For shortness of breath/cough           Triage Vitals: BP 119/67  Pulse 99  Temp(Src) 98.2 F (36.8 C) (Oral)  Resp 24  Ht 4\' 8"  (1.422 m)  Wt 105 lb (47.628 kg)  BMI 23.55 kg/m2  SpO2 100%  Physical Exam  Constitutional: She is oriented to person, place, and time. She appears well-developed and well-nourished.  HENT:  Head: Normocephalic and atraumatic.  No active bleeding. Area of abrasion to the left septum. Oropharynx clear. No septal hematoma.   Eyes: Conjunctivae  and EOM are normal. Pupils are equal, round, and reactive to light.  Neck: Normal range of motion. Neck supple.  Cardiovascular: Normal rate, regular rhythm and normal heart sounds.   Pulmonary/Chest: Effort normal and breath sounds normal.  Abdominal: Soft. She exhibits no distension. There is no tenderness.  Musculoskeletal: Normal range of motion.  Neurological: She is alert and oriented to person, place, and time.  Skin: Skin is warm and dry.  Psychiatric: She has a normal mood and affect. Her behavior is normal.    ED Course  EPISTAXIS MANAGEMENT Date/Time: 10/24/2012 11:30 PM Performed by: Glynn Octave Authorized by: Glynn Octave Consent: Verbal consent obtained. Risks and benefits: risks, benefits and alternatives were discussed Consent given by: patient Patient understanding: patient states understanding of the procedure being performed Patient identity confirmed: verbally with patient Time out: Immediately prior to procedure a "time out" was called to verify the correct patient,  procedure, equipment, support staff and site/side marked as required. Anesthesia: local infiltration Local anesthetic: topical anesthetic Patient sedated: no Treatment site: left anterior Repair method: cophenylcaine and silver nitrate Post-procedure assessment: bleeding stopped Treatment complexity: simple Patient tolerance: Patient tolerated the procedure well with no immediate complications.   (including critical care time) DIAGNOSTIC STUDIES: Oxygen Saturation is 100% on room air, normal by my interpretation.    COORDINATION OF CARE: 12:11 AM- Patient informed of current plan for treatment and evaluation and agrees with plan at this time.      Labs Reviewed  CBC WITH DIFFERENTIAL - Abnormal; Notable for the following:    WBC 3.9 (*)    RBC 3.25 (*)    Hemoglobin 10.3 (*)    HCT 29.9 (*)    Platelets 88 (*)    Neutrophils Relative 94 (*)    Lymphocytes Relative 3 (*)    Lymphs Abs 0.1 (*)    Monocytes Relative 2 (*)    All other components within normal limits  PROTIME-INR - Abnormal; Notable for the following:    Prothrombin Time 15.6 (*)    All other components within normal limits  BASIC METABOLIC PANEL   No results found.   1. Epistaxis   2. Thrombocytopenia       MDM  Atraumatic left-sided epistaxis for the past 2 hours. Similar episode last night that resolved spontaneously. No previous history of nosebleeds. No anticoagulants. No trauma.  Friable area on the left nasal septum. No active bleeding.  Bleeding controlled with silver nitrate cautery. Hemoglobin stable. Mild thrombocytopenia which is not unexpected given patient's oncologic history. Stable for followup with oncologist. Return precautions discussed.    I personally performed the services described in this documentation, which was scribed in my presence. The recorded information has been reviewed and is accurate.   Glynn Octave, MD 10/25/12 [phone]

## 2012-10-24 NOTE — ED Notes (Signed)
Nosebleed yesterday and stopped, nosebleed reoccurred an hour ago, oozing slightly at this time

## 2012-10-25 LAB — BASIC METABOLIC PANEL
BUN: 10 mg/dL (ref 6–23)
Chloride: 101 mEq/L (ref 96–112)
Creatinine, Ser: 0.73 mg/dL (ref 0.50–1.10)
GFR calc Af Amer: >90 mL/min (ref >90)

## 2012-10-30 ENCOUNTER — Emergency Department (HOSPITAL_COMMUNITY)
Admission: EM | Admit: 2012-10-30 | Discharge: 2012-10-30 | Disposition: A | Discharge status: Home / Self Care | Payer: Medicaid Other | Attending: Emergency Medicine | Admitting: Emergency Medicine

## 2012-10-30 ENCOUNTER — Encounter (HOSPITAL_COMMUNITY): Payer: Self-pay | Admitting: *Deleted

## 2012-10-30 DIAGNOSIS — Z8589 Personal history of malignant neoplasm of other organs and systems: Secondary | ICD-10-CM | POA: Insufficient documentation

## 2012-10-30 DIAGNOSIS — Z79899 Other long term (current) drug therapy: Secondary | ICD-10-CM | POA: Insufficient documentation

## 2012-10-30 DIAGNOSIS — D696 Thrombocytopenia, unspecified: Secondary | ICD-10-CM | POA: Insufficient documentation

## 2012-10-30 DIAGNOSIS — R040 Epistaxis: Secondary | ICD-10-CM | POA: Insufficient documentation

## 2012-10-30 DIAGNOSIS — Z85831 Personal history of malignant neoplasm of soft tissue: Secondary | ICD-10-CM

## 2012-10-30 LAB — CBC WITH DIFFERENTIAL/PLATELET
Basophils Absolute: 0 10*3/uL (ref 0.0–0.1)
Basophils Relative: 0 % (ref 0–1)
Lymphocytes Relative: 6 % — ABNORMAL LOW (ref 12–46)
MCHC: 34.3 g/dL (ref 30.0–36.0)
Neutro Abs: 3.1 10*3/uL (ref 1.7–7.7)
Neutrophils Relative %: 90 % — ABNORMAL HIGH (ref 43–77)
RBC: 2.95 MIL/uL — ABNORMAL LOW (ref 3.87–5.11)
RDW: 14 % (ref 11.5–15.5)

## 2012-10-30 LAB — COMPREHENSIVE METABOLIC PANEL
ALT: 26 U/L (ref 0–35)
AST: 39 U/L — ABNORMAL HIGH (ref 0–37)
Albumin: 4.1 g/dL (ref 3.5–5.2)
Alkaline Phosphatase: 213 U/L — ABNORMAL HIGH (ref 39–117)
CO2: 26 mEq/L (ref 19–32)
Chloride: 102 mEq/L (ref 96–112)
GFR calc non Af Amer: >90 mL/min (ref >90)
Potassium: 3.2 mEq/L — ABNORMAL LOW (ref 3.5–5.1)
Total Bilirubin: 0.7 mg/dL (ref 0.3–1.2)

## 2012-10-30 LAB — PROTIME-INR: INR: 1.33 (ref 0.00–1.49)

## 2012-10-30 NOTE — ED Notes (Signed)
Chart was opened to give PCP Dr. Vickki Muff pt's lab results.

## 2012-10-30 NOTE — ED Notes (Signed)
Nose bleed ,onset 1215pm.  Seen here recently for same.

## 2012-10-30 NOTE — ED Provider Notes (Signed)
History     This chart was scribed for Geoffery Lyons, MD, MD by Smitty Pluck, ED Scribe. The patient was seen in room APA10/APA10 and the patient's care was started at 1:18 PM.   CSN: [id number]  Arrival date & time 10/30/12  1242   None     Chief Complaint  Patient presents with  . Epistaxis     HPI TIFANI DACK is a 22 y.o. female who presents to the Emergency Department complaining of epistaxis suddenly onset today 1 hour ago. She reports that she has hx of epistaxis. She mentions that she has used pressure to control bleeding. Pt reports that she has metastatic sarcoma diagnosed in 2004 and she takes oral medication for it. Pt denies injury to nose, fever, chills, nausea, vomiting, diarrhea, weakness, cough, SOB and any other pain.   Past Medical History  Diagnosis Date  . Metastatic sarcoma     Past Surgical History  Procedure Laterality Date  . Portacath placement    . Pain pump implantation      History reviewed. No pertinent family history.  History  Substance Use Topics  . Smoking status: Never Smoker   . Smokeless tobacco: Not on file  . Alcohol Use: No    OB History   Grav Para Term Preterm Abortions TAB SAB Ect Mult Living                  Review of Systems 10 Systems reviewed and all are negative for acute change except as noted in the HPI.   Allergies  Pork-derived products  Home Medications   Current Outpatient Rx  Name  Route  Sig  Dispense  Refill  . albuterol (VENTOLIN HFA) 108 (90 BASE) MCG/ACT inhaler   Inhalation   Inhale 2 puffs into the lungs every 6 (six) hours as needed. For shortness of breath/cough         . celecoxib (CELEBREX) 100 MG capsule   Oral   Take 100 mg by mouth 2 (two) times daily.          Marland Kitchen HYDROmorphone (DILAUDID) 8 MG tablet   Oral   Take 8 mg by mouth 2 (two) times daily.         . Menthol-Methyl Salicylate (MUSCLE RUB) 10-15 % CREA   Topical   Apply 1 application topically as needed. For muscle  pain reilef         . morphine (MS CONTIN) 30 MG 12 hr tablet   Oral   Take 30 mg by mouth 2 (two) times daily.         . Oxycodone HCl 10 MG TABS   Oral   Take 10 mg by mouth 4 (four) times daily.            BP 105/56  Pulse 117  Temp(Src) 99.4 F (37.4 C) (Oral)  Resp 18  Ht 4\' 8"  (1.422 m)  Wt 105 lb (47.628 kg)  BMI 23.55 kg/m2  SpO2 100%  Physical Exam  Nursing note and vitals reviewed. Constitutional: She is oriented to person, place, and time. She appears well-developed and well-nourished. No distress.  HENT:  Head: Normocephalic and atraumatic.  There is blood in left nare. There is no active bleeding. The mucosa appears nl and septum is midline.   Eyes: EOM are normal. Pupils are equal, round, and reactive to light.  Neck: Normal range of motion. Neck supple. No tracheal deviation present.  Cardiovascular: Normal rate, regular rhythm and normal  heart sounds.   Pulmonary/Chest: Effort normal and breath sounds normal. No respiratory distress.  Abdominal: Soft. She exhibits no distension.  Musculoskeletal: Normal range of motion.  Neurological: She is alert and oriented to person, place, and time.  Skin: Skin is warm and dry.  Psychiatric: She has a normal mood and affect. Her behavior is normal.    ED Course  Procedures (including critical care time) DIAGNOSTIC STUDIES: Oxygen Saturation is 100% on room air, normal by my interpretation.    COORDINATION OF CARE: 1:22 PM Discussed ED treatment with pt and pt agrees.     Labs Reviewed  CBC WITH DIFFERENTIAL - Abnormal; Notable for the following:    WBC 3.4 (*)    RBC 2.95 (*)    Hemoglobin 9.3 (*)    HCT 27.1 (*)    Platelets 67 (*)    Neutrophils Relative 90 (*)    Lymphocytes Relative 6 (*)    Lymphs Abs 0.2 (*)    All other components within normal limits  COMPREHENSIVE METABOLIC PANEL - Abnormal; Notable for the following:    Potassium 3.2 (*)    AST 39 (*)    Alkaline Phosphatase 213 (*)     All other components within normal limits  PROTIME-INR - Abnormal; Notable for the following:    Prothrombin Time 16.2 (*)    All other components within normal limits  APTT - Abnormal; Notable for the following:    aPTT 38 (*)    All other components within normal limits   No results found.   No diagnosis found.    MDM  The patient presents with a nosebleed that stopped spontaneously shortly after arrival to the ED.  She is oral chemotherapeutic medications that can drop her platelet count.  This was checked today and was 67, down from 88 one week ago when she experienced a similar episode.  I have spoken with Dr. Christin Bach who is her oncologist at [facility] who has recommended I stop her Etoposide.  I see no reason to perform a packing or cauterization and feel as though direct pressure would be adequate should this recur.     I personally performed the services described in this documentation, which was scribed in my presence. The recorded information has been reviewed and is accurate.        Geoffery Lyons, MD 10/30/12 [phone]

## 2012-10-30 NOTE — ED Notes (Signed)
Pt refused to put a hospital gown on.

## 2013-01-22 ENCOUNTER — Ambulatory Visit (HOSPITAL_COMMUNITY)
Admission: RE | Admit: 2013-01-22 | Discharge: 2013-01-22 | Disposition: A | Discharge status: Home / Self Care | Payer: Medicaid Other | Source: Ambulatory Visit | Attending: Pediatric Hematology and Oncology | Admitting: Pediatric Hematology and Oncology

## 2013-01-22 DIAGNOSIS — M25569 Pain in unspecified knee: Secondary | ICD-10-CM | POA: Insufficient documentation

## 2013-01-22 DIAGNOSIS — R269 Unspecified abnormalities of gait and mobility: Secondary | ICD-10-CM | POA: Insufficient documentation

## 2013-01-22 DIAGNOSIS — M25559 Pain in unspecified hip: Secondary | ICD-10-CM | POA: Insufficient documentation

## 2013-01-22 DIAGNOSIS — IMO0001 Reserved for inherently not codable concepts without codable children: Secondary | ICD-10-CM | POA: Insufficient documentation

## 2013-01-22 DIAGNOSIS — R29898 Other symptoms and signs involving the musculoskeletal system: Secondary | ICD-10-CM | POA: Insufficient documentation

## 2013-01-30 ENCOUNTER — Ambulatory Visit (HOSPITAL_COMMUNITY)
Admission: RE | Admit: 2013-01-30 | Discharge: 2013-01-30 | Disposition: A | Discharge status: Home / Self Care | Payer: Medicaid Other | Source: Ambulatory Visit | Attending: Pediatrics | Admitting: Pediatrics
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
## 2013-01-30 NOTE — Progress Notes (Signed)
Physical Therapy Treatment Patient Details  Name: Stacey Murphy MRN: [id number] Date of Birth: November 13, 1990  Today's Date: 01/30/2013 Time: 1400-1430 PT Time Calculation (min): 30 min Charges: TE: 1400-1430 Visit#: 2 of 10  Re-eval: 02/21/13 Assessment Diagnosis: Bil hip surgery Surgical Date: 12/13/12 Next MD Visit: Dr. Darral Dash - August 20th, Dr. Vickki Muff - July 10th   Authorization: Medicaid  Authorization Time Period:    Authorization Visit#: 2 of 10   Subjective: Symptoms/Limitations Symptoms: Pt reports that she has been transfering pretty well.  Feels she needs to work on gait.  Pain Assessment Currently in Pain?: Yes Pain Score: 6  Pain Location: Knee Pain Orientation: Right  Precautions/Restrictions     Exercise/Treatments Standing Gait Training: Hopping forward and back 3 reps at a time x10 Seated Long Arc Quad: AROM;15 reps;Weights Long Arc Quad Weight: 2 lbs. Other Seated Knee Exercises: Hip abduction x20 blue band, hip adduction isometric 10x10 sec holds    Physical Therapy Assessment and Plan PT Assessment and Plan Clinical Impression Statement: Pt is greatly limited by increased fatigue, requiring 3 rest breaks which she completed her exercises.  States significant fatigue at end of session.  Has improved hopping length at end of session.   PT Plan: Continue to improve her LLE activity tolerance and hopping gait as able.  Pt unable to tolerate supine, prone or S/L postion.  Added seated heel and toe raises next vist.     Goals    Problem List Patient Active Problem List   Diagnosis Date Noted  . Bilateral leg weakness 07/11/2011  . Difficulty in walking 07/11/2011    PT Plan of Care PT Home Exercise Plan: provided with green t-band PT Patient Instructions: Chilton Si t-band for hip abduction, hamstring curls and knee flexion.  Consulted and Agree with Plan of Care: Patient  GP    Stacey Murphy 01/30/2013, 2:35 PM

## 2013-02-02 ENCOUNTER — Emergency Department (HOSPITAL_COMMUNITY): Payer: Medicaid Other

## 2013-02-02 ENCOUNTER — Emergency Department (HOSPITAL_COMMUNITY)
Admission: EM | Admit: 2013-02-02 | Discharge: 2013-02-02 | Disposition: A | Discharge status: Other Acute Inpatient Hospital | Payer: Medicaid Other | Attending: Emergency Medicine | Admitting: Emergency Medicine

## 2013-02-02 ENCOUNTER — Encounter (HOSPITAL_COMMUNITY): Payer: Self-pay

## 2013-02-02 DIAGNOSIS — R071 Chest pain on breathing: Secondary | ICD-10-CM | POA: Insufficient documentation

## 2013-02-02 DIAGNOSIS — R42 Dizziness and giddiness: Secondary | ICD-10-CM | POA: Insufficient documentation

## 2013-02-02 DIAGNOSIS — Z9981 Dependence on supplemental oxygen: Secondary | ICD-10-CM | POA: Insufficient documentation

## 2013-02-02 DIAGNOSIS — R0602 Shortness of breath: Secondary | ICD-10-CM | POA: Insufficient documentation

## 2013-02-02 DIAGNOSIS — M79604 Pain in right leg: Secondary | ICD-10-CM

## 2013-02-02 DIAGNOSIS — C493 Malignant neoplasm of connective and soft tissue of thorax: Secondary | ICD-10-CM | POA: Insufficient documentation

## 2013-02-02 DIAGNOSIS — R52 Pain, unspecified: Secondary | ICD-10-CM

## 2013-02-02 DIAGNOSIS — M79609 Pain in unspecified limb: Secondary | ICD-10-CM | POA: Insufficient documentation

## 2013-02-02 DIAGNOSIS — R0789 Other chest pain: Secondary | ICD-10-CM

## 2013-02-02 DIAGNOSIS — C799 Secondary malignant neoplasm of unspecified site: Secondary | ICD-10-CM

## 2013-02-02 DIAGNOSIS — Z7901 Long term (current) use of anticoagulants: Secondary | ICD-10-CM | POA: Insufficient documentation

## 2013-02-02 DIAGNOSIS — Z79899 Other long term (current) drug therapy: Secondary | ICD-10-CM | POA: Insufficient documentation

## 2013-02-02 DIAGNOSIS — M8448XA Pathological fracture, other site, initial encounter for fracture: Secondary | ICD-10-CM | POA: Insufficient documentation

## 2013-02-02 DIAGNOSIS — Z9889 Other specified postprocedural states: Secondary | ICD-10-CM | POA: Insufficient documentation

## 2013-02-02 LAB — CBC WITH DIFFERENTIAL/PLATELET
Basophils Absolute: 0 10*3/uL (ref 0.0–0.1)
Eosinophils Absolute: 0.1 10*3/uL (ref 0.0–0.7)
HCT: 30.8 % — ABNORMAL LOW (ref 36.0–46.0)
Lymphocytes Relative: 14 % (ref 12–46)
MCH: 31.4 pg (ref 26.0–34.0)
MCHC: 33.4 g/dL (ref 30.0–36.0)
Monocytes Absolute: 0.3 10*3/uL (ref 0.1–1.0)
Neutro Abs: 3.6 10*3/uL (ref 1.7–7.7)
RDW: 22.7 % — ABNORMAL HIGH (ref 11.5–15.5)

## 2013-02-02 LAB — BASIC METABOLIC PANEL
BUN: 10 mg/dL (ref 6–23)
Calcium: 9 mg/dL (ref 8.4–10.5)
Creatinine, Ser: 0.67 mg/dL (ref 0.50–1.10)
GFR calc Af Amer: >90 mL/min (ref >90)
GFR calc non Af Amer: >90 mL/min (ref >90)
Potassium: 3.1 mEq/L — ABNORMAL LOW (ref 3.5–5.1)

## 2013-02-02 MED ORDER — DIPHENHYDRAMINE HCL 50 MG/ML IJ SOLN
50.0000 mg | Freq: Once | INTRAMUSCULAR | Status: AC
  Administered 2013-02-02: 50 mg via INTRAVENOUS
  Filled 2013-02-02: qty 1

## 2013-02-02 MED ORDER — HYDROMORPHONE HCL PF 2 MG/ML IJ SOLN
INTRAMUSCULAR | Status: AC
  Administered 2013-02-02: 2 mg via INTRAVENOUS
  Filled 2013-02-02: qty 1

## 2013-02-02 MED ORDER — HYDROMORPHONE HCL PF 2 MG/ML IJ SOLN
2.0000 mg | Freq: Once | INTRAMUSCULAR | Status: AC
  Administered 2013-02-02: 2 mg via INTRAVENOUS
  Filled 2013-02-02: qty 1

## 2013-02-02 MED ORDER — IOHEXOL 350 MG/ML SOLN
100.0000 mL | Freq: Once | INTRAVENOUS | Status: AC | PRN
  Administered 2013-02-02: 100 mL via INTRAVENOUS

## 2013-02-02 MED ORDER — KETAMINE HCL 10 MG/ML IJ SOLN
0.1500 mg/kg | Freq: Once | INTRAMUSCULAR | Status: AC
  Administered 2013-02-02: 6.5 mg via INTRAVENOUS
  Filled 2013-02-02: qty 1

## 2013-02-02 MED ORDER — HYDROMORPHONE HCL PF 1 MG/ML IJ SOLN
1.0000 mg | Freq: Once | INTRAMUSCULAR | Status: AC
  Administered 2013-02-02: 1 mg via INTRAVENOUS
  Filled 2013-02-02: qty 1

## 2013-02-02 MED ORDER — SODIUM CHLORIDE 0.9 % IV SOLN
Freq: Once | INTRAVENOUS | Status: AC
  Administered 2013-02-02: 10:00:00 via INTRAVENOUS

## 2013-02-02 MED ORDER — SODIUM CHLORIDE 0.9 % IV BOLUS (SEPSIS)
1000.0000 mL | Freq: Once | INTRAVENOUS | Status: AC
  Administered 2013-02-02: 1000 mL via INTRAVENOUS

## 2013-02-02 MED ORDER — HYDROMORPHONE HCL PF 2 MG/ML IJ SOLN: 2.0000 mg | Freq: Once | INTRAMUSCULAR | Status: AC

## 2013-02-02 NOTE — ED Notes (Signed)
Pt c/o stabbing chest pain that started this am around 0730.  Reports pain progressively getting worse.  Says chest is tender to palpate.  Pt has history of metastatic cancer.

## 2013-02-02 NOTE — ED Notes (Signed)
The patient moaning out in pain occasionally, no further crying, watching television at times, does appear to be in severe pain at times.

## 2013-02-02 NOTE — ED Notes (Signed)
EMS administered 10mg  Morphine IV.

## 2013-02-02 NOTE — ED Notes (Addendum)
Patient states she has a port a cath, however no one is allowed to access the device except [facility].  Pt crying, complaining of pain.

## 2013-02-02 NOTE — ED Notes (Signed)
Patient states that her pain is minimally improved, MD aware.

## 2013-02-02 NOTE — ED Provider Notes (Signed)
History    CSN: [id number] Arrival date & time 02/02/13  0930  First MD Initiated Contact with Patient 02/02/13 [phone]     Chief Complaint  Patient presents with  . Chest Pain   (Consider location/radiation/quality/duration/timing/severity/associated sxs/prior Treatment) HPI Comments: 22 yo female with unfortunate metastatic sarcoma, on complex pain medicine regmine with specialist and is followed at [facility] by Dr Christin Bach presents with left cp, sharp, non radiating, different than previous.  Pt on xeralto.  Mild sob.  On O2 as needed at home.  Pt also has right proximal thigh pain, worse than usual since surgery earlier this year.  No known blood clots.  Pain more severe than any pain in the past.    Patient is a 22 y.o. female presenting with chest pain. The history is provided by the patient.  Chest Pain Pain location:  Substernal area Pain quality: sharp   Pain radiates to the back: no   Pain severity:  Severe Onset quality:  Unable to specify Timing:  Constant Chronicity:  New Context: at rest   Relieved by:  Nothing Associated symptoms: shortness of breath   Associated symptoms: no abdominal pain, no cough, no fever, no headache and not vomiting    Past Medical History  Diagnosis Date  . Metastatic sarcoma    Past Surgical History  Procedure Laterality Date  . Portacath placement    . Pain pump implantation    . Hip surgery     No family history on file. History  Substance Use Topics  . Smoking status: Never Smoker   . Smokeless tobacco: Not on file  . Alcohol Use: No   OB History   Grav Para Term Preterm Abortions TAB SAB Ect Mult Living                 Review of Systems  Constitutional: Positive for appetite change. Negative for fever and chills.  HENT: Negative for neck pain and neck stiffness.   Eyes: Negative for visual disturbance.  Respiratory: Positive for shortness of breath. Negative for cough.   Cardiovascular: Positive for chest pain.   Gastrointestinal: Negative for vomiting and abdominal pain.  Genitourinary: Negative for dysuria.  Musculoskeletal: Positive for arthralgias.  Skin: Negative for rash.  Neurological: Positive for light-headedness. Negative for headaches.    Allergies  Pork-derived products  Home Medications   Current Outpatient Rx  Name  Route  Sig  Dispense  Refill  . albuterol (VENTOLIN HFA) 108 (90 BASE) MCG/ACT inhaler   Inhalation   Inhale 2 puffs into the lungs every 6 (six) hours as needed. For shortness of breath/cough         . celecoxib (CELEBREX) 100 MG capsule   Oral   Take 100 mg by mouth 2 (two) times daily.          Marland Kitchen HYDROmorphone (DILAUDID) 8 MG tablet   Oral   Take 8 mg by mouth 2 (two) times daily.         . Menthol-Methyl Salicylate (MUSCLE RUB) 10-15 % CREA   Topical   Apply 1 application topically as needed. For muscle pain reilef         . morphine (MS CONTIN) 30 MG 12 hr tablet   Oral   Take 30 mg by mouth 2 (two) times daily.          BP 141/102  Pulse 114  Temp(Src) 98.1 F (36.7 C) (Oral)  Resp 16  Ht 4\' 8"  (1.422 m)  Wt 95 lb (43.092 kg)  BMI 21.31 kg/m2  SpO2 99% Physical Exam  Nursing note and vitals reviewed. Constitutional: She is oriented to person, place, and time. She appears well-developed and well-nourished. She appears distressed.  HENT:  Head: Normocephalic and atraumatic.  Eyes: Conjunctivae are normal. Pupils are equal, round, and reactive to light.  Neck: Normal range of motion. Neck supple.  Cardiovascular: Normal rate and regular rhythm.   Abdominal: Soft. She exhibits no distension. There is tenderness (mild central).  Musculoskeletal: She exhibits tenderness. She exhibits no edema.  Neurological: She is alert and oriented to person, place, and time. GCS eye subscore is 4. GCS verbal subscore is 5. GCS motor subscore is 6.  Sensation LE intact bilateral in major nerves. Difficult to assess strength due to pain- pt flexes  and ext knees/ ankles with 4+ strength bilateral  Skin: Skin is warm.    ED Course  Procedures (including critical care time) Labs Reviewed  CBC WITH DIFFERENTIAL - Abnormal; Notable for the following:    RBC 3.28 (*)    Hemoglobin 10.3 (*)    HCT 30.8 (*)    RDW 22.7 (*)    All other components within normal limits  BASIC METABOLIC PANEL - Abnormal; Notable for the following:    Potassium 3.1 (*)    All other components within normal limits   No results found. No diagnosis found.  MDM   Complex patient. Pain difficult to control in ED due to history/ pain regimine. Filed Vitals:   02/02/13 0944 02/02/13 1000 02/02/13 1030 02/02/13 1100  BP:  141/103 141/102 147/102  Pulse:  114  110  Temp: 98.1 F (36.7 C)     TempSrc: Oral     Resp:  12 16 13   Height:      Weight:      SpO2:        Date: 02/02/2013  Rate: 122  Rhythm: sinus tachycardia  QRS Axis: normal  Intervals: QT prolonged  ST/T Wave abnormalities: nonspecific ST changes  Conduction Disutrbances:none  Narrative Interpretation:   Old EKG Reviewed: none available   Dilaudid given, rechecked and pain still severe, repeat doses given.  CT done due to moderate clinical risk for PE, radiology called no PE however new  Pathologic T12 fracture with displacement.   Paged [facility] Dr Vickki Muff group.  Ct Angio Chest Pe W/cm &/or Wo Cm  02/02/2013   *RADIOLOGY REPORT*  Clinical Data: Metastatic sarcoma.  Chest pain  CT ANGIOGRAPHY CHEST  Technique:  Multidetector CT imaging of the chest using the standard protocol during bolus administration of intravenous contrast. Multiplanar reconstructed images including MIPs were obtained and reviewed to evaluate the vascular anatomy.  Contrast: OMNIPAQUE IOHEXOL 350 MG/ML SOLN  Comparison: CT thoracic spine 08/19/2011.  CT chest 08/03/2010  Findings: Negative for pulmonary embolism.  Normal appearing aorta and heart.  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   There are multiple pulmonary nodules which are more numerous and have enlarged in the interval.  Index nodule in the right lower lobe posteriorly previously measured 13 mm and now measures 22 x 24 mm. No mediastinal adenopathy.  No pleural effusion.  Negative for pneumonia.  Severe pathologic fracture at T12.  There was a metastatic deposit in this vertebral body on 08/19/2011 without fracture.  There is now marked kyphoscoliosis convex to the left at the fracture site with displacement of T11 relative to L2.  There is a spinal infusion catheter in the lower thoracic  spine.  No other bony lesions are identified.  IMPRESSION: Negative for pulmonary embolism  Progression of widespread pulmonary metastatic disease.  Severe pathologic fracture T12 with displacement and kyphoscoliosis.  This could cause spinal cord injury.  Consider thoracic MRI for further evaluation.  I called the critical results to Dr. Bebe Shaggy at the time of the report.   Original Report Authenticated By: Janeece Riggers, M.D.   Rechecked, pain severe, patient asking for more medicines, discussed use of low dose Ketamine to enhance opioids and help with severe pain, pt understands this is not standard opiates/ pain meds in ED and is okay with trying.  Pt is on a monitor, .15 mg per kg ordered.    Discussed case with Dr Mal Amabile, Dr Doylene Canning will accept at [facility]   Patient requires advanced care for pain control, ?unstable thoracic fracture and oncology specialists. Discussed transfer with pt, r/b, pt agrees.   Pain improved on recheck.  Transfer forms done.    Enid Skeens, MD 02/02/13 [phone]

## 2013-02-07 ENCOUNTER — Inpatient Hospital Stay (HOSPITAL_COMMUNITY): Admission: RE | Admit: 2013-02-07 | Payer: Medicaid Other | Source: Ambulatory Visit

## 2013-02-16 ENCOUNTER — Encounter (HOSPITAL_COMMUNITY): Payer: Self-pay | Admitting: *Deleted

## 2013-02-16 ENCOUNTER — Emergency Department (HOSPITAL_COMMUNITY)
Admission: EM | Admit: 2013-02-16 | Discharge: 2013-02-17 | Disposition: A | Discharge status: Home / Self Care | Payer: Medicaid Other | Attending: Emergency Medicine | Admitting: Emergency Medicine

## 2013-02-16 DIAGNOSIS — R079 Chest pain, unspecified: Secondary | ICD-10-CM | POA: Insufficient documentation

## 2013-02-16 DIAGNOSIS — Z4689 Encounter for fitting and adjustment of other specified devices: Secondary | ICD-10-CM | POA: Insufficient documentation

## 2013-02-16 DIAGNOSIS — Z8589 Personal history of malignant neoplasm of other organs and systems: Secondary | ICD-10-CM | POA: Insufficient documentation

## 2013-02-16 DIAGNOSIS — R0602 Shortness of breath: Secondary | ICD-10-CM | POA: Insufficient documentation

## 2013-02-16 DIAGNOSIS — Z79899 Other long term (current) drug therapy: Secondary | ICD-10-CM | POA: Insufficient documentation

## 2013-02-16 DIAGNOSIS — M2550 Pain in unspecified joint: Secondary | ICD-10-CM | POA: Insufficient documentation

## 2013-02-16 DIAGNOSIS — Z452 Encounter for adjustment and management of vascular access device: Secondary | ICD-10-CM

## 2013-02-16 NOTE — ED Notes (Signed)
Pt needs port accessed, home health nurse has tried 4 or 5 times. Pt is on a home PCA.

## 2013-02-17 ENCOUNTER — Emergency Department (HOSPITAL_COMMUNITY): Payer: Medicaid Other

## 2013-02-17 NOTE — ED Notes (Signed)
Attempted to access port a cath. Needle inserted into center of port a cath. Met resistance, no blood return, would not flush.

## 2013-02-17 NOTE — ED Provider Notes (Signed)
Medical screening examination/treatment/procedure(s) were conducted as a shared visit with non-physician practitioner(s) and myself.  I personally evaluated the patient during the encounter.  The patient has a history of metastatic cancer.  She presents with complaints of port malfunction.  She receives pain meds through a left subclavian portacath.  The home health nurse was unable to draw blood and push meds through the port this evening and she was told to come here.  No other complaints.   On exam, the vitals are stable and she is afebrile.  The heart and lung exam is unremarkable.  There is a port noted in the left subclavian area without erythema or redness.    Chest xray shows no acute issue such as a kink in the tubing or other problems.  We were unable to draw blood or infuse through the port.  A peripheral iv will be placed for her medication infusion.  She had the port placed in [facility] and will call them in on Monday to arrange follow up.    Geoffery Lyons, MD 02/17/13 [phone]

## 2013-02-17 NOTE — ED Notes (Signed)
Pt requesting arm board, applied and wrapped. Pt hooked her pain pump in iv. Mother pushed pt out of the dept in wheelchair. Child with pt.

## 2013-02-17 NOTE — ED Provider Notes (Signed)
CSN: [id number]     Arrival date & time 02/16/13  2308 History     First MD Initiated Contact with Patient 02/16/13 2325     Chief Complaint  Patient presents with  . 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 She is here tonight due to inability to access her power port for IV pain administration. Home Health team unable to access the port on several attempts today. No fever. No bleeding or bruising at the port site. No new tenderness present.  The history is provided by the patient.    Past Medical History  Diagnosis Date  . Metastatic sarcoma    Past Surgical History  Procedure Laterality Date  . Portacath placement    . Pain pump implantation    . Hip surgery     No family history on file. History  Substance Use Topics  . Smoking status: Never Smoker   . Smokeless tobacco: Not on file  . Alcohol Use: No   OB History   Grav Para Term Preterm Abortions TAB SAB Ect Mult Living                 Review of Systems  Constitutional: Positive for appetite change. Negative for activity change.       All ROS Neg except as noted in HPI  HENT: Negative for nosebleeds and neck pain.   Eyes: Negative for photophobia and discharge.  Respiratory: Positive for shortness of breath. Negative for cough and wheezing.   Cardiovascular: Positive for chest pain. Negative for palpitations.  Gastrointestinal: Negative for abdominal pain and blood in stool.  Genitourinary: Negative for dysuria, frequency and hematuria.  Musculoskeletal: Positive for arthralgias. Negative for back pain.  Skin: Negative.   Neurological: Negative for dizziness, seizures and speech difficulty.  Psychiatric/Behavioral: Negative for hallucinations and confusion.    Allergies  Pork-derived products  Home Medications    Current Outpatient Rx  Name  Route  Sig  Dispense  Refill  . acetaminophen (TYLENOL) 650 MG CR tablet   Oral   Take 650 mg by mouth daily.         Marland Kitchen albuterol (VENTOLIN HFA) 108 (90 BASE) MCG/ACT inhaler   Inhalation   Inhale 2 puffs into the lungs every 6 (six) hours as needed. For shortness of breath/cough         . celecoxib (CELEBREX) 100 MG capsule   Oral   Take 100 mg by mouth daily.          . cyclobenzaprine (FLEXERIL) 10 MG tablet   Oral   Take 10 mg by mouth 3 (three) times daily as needed for muscle spasms.         Marland Kitchen docusate sodium (COLACE) 100 MG capsule   Oral   Take 100 mg by mouth daily as needed for constipation.         . fentaNYL (DURAGESIC - DOSED MCG/HR) 100 MCG/HR   Transdermal   Place 2 patches onto the skin every 3 (three) days.         . megestrol (MEGACE) 40 MG/ML suspension   Oral   Take 100 mg by mouth 3 (three) times daily.         . NON FORMULARY      Morphine PCA 1000mg  in         . omeprazole (PRILOSEC) 20 MG capsule  Oral   Take 20 mg by mouth 2 (two) times daily.         Marland Kitchen oxyCODONE (OXYCONTIN) 10 MG 12 hr tablet   Oral   Take 10 mg by mouth every 12 (twelve) hours.         Marland Kitchen oxycodone (ROXICODONE) 30 MG immediate release tablet   Oral   Take 30 mg by mouth every 4 (four) hours as needed for pain.         . pregabalin (LYRICA) 50 MG capsule   Oral   Take 50 mg by mouth 2 (two) times daily.         Marland Kitchen PRESCRIPTION MEDICATION   Intracatheter   by Intracatheter route. Dilaudid, Clonidine, Bupivacaine.         . rivaroxaban (XARELTO) 10 MG TABS tablet   Oral   Take 10 mg by mouth daily.          BP 126/72  Pulse 109  Temp(Src) 97.9 F (36.6 C) (Oral)  Resp 20  Ht 4\' 8"  (1.422 m)  Wt 97 lb (43.999 kg)  BMI 21.76 kg/m2  SpO2 100% Physical Exam  Nursing note and vitals reviewed. Constitutional: She is oriented to person, place, and time. She appears well-developed and well-nourished.   Non-toxic appearance.  HENT:  Head: Normocephalic.  Right Ear: Tympanic membrane and external ear normal.  Left Ear: Tympanic membrane and external ear normal.  Eyes: EOM and lids are normal. Pupils are equal, round, and reactive to light.  Neck: Normal range of motion. Neck supple. Carotid bruit is not present.  Cardiovascular: Normal rate, regular rhythm, normal heart sounds, intact distal pulses and normal pulses.   Power port in the left upper anterior chest palpable.  Pulmonary/Chest: Breath sounds normal. No respiratory distress.  Abdominal: Soft. Bowel sounds are normal. There is no tenderness. There is no guarding.  Musculoskeletal: She exhibits tenderness.  Lymphadenopathy:       Head (right side): No submandibular adenopathy present.       Head (left side): No submandibular adenopathy present.    She has no cervical adenopathy.  Neurological: She is alert and oriented to person, place, and time. She has normal strength. No sensory deficit.  Skin: Skin is warm and dry.  Psychiatric: She has a normal mood and affect. Her speech is normal.    ED Course   Procedures (including critical care time)  Labs Reviewed - No data to display Dg Chest 2 View  02/17/2013   *RADIOLOGY REPORT*  Clinical Data: Metastatic sarcoma, vascular access problem, Port-A- Cath  CHEST - 2 VIEW  Comparison: CTA chest dated 07/12 1014  Findings: Low lung volumes.  Widespread pulmonary nodules/metastases, better visualized on CT.  No pleural effusion or pneumothorax.  Mild cardiomegaly, although possibly exacerbated by poor inspiration.  Left subclavian chest port terminating at the cavoatrial junction.  Degenerative changes of the visualized thoracolumbar spine.  Severe T12-L1 compression deformities with kyphosis.  IMPRESSION: Widespread pulmonary nodules/metastases, better visualized on CT.  Left subclavian chest port terminating at the cavoatrial junction.   Original Report Authenticated By: Charline Bills, M.D.   No diagnosis found.  MDM  **I have reviewed nursing notes, vital signs, and all appropriate lab and imaging results for this patient.* Nursing staff attempted to access port without success. Xray does not reveal an break, kink, or dislocation of the port.  Pt seen with me by Dr Judd Lien. Plan - insert a peripheral IV and have MD at [facility]  re-assess port tomorrow.  Kathie Dike, PA-C 02/17/13 0126

## 2013-02-21 ENCOUNTER — Telehealth (HOSPITAL_COMMUNITY): Payer: Self-pay

## 2013-04-14 ENCOUNTER — Encounter (HOSPITAL_COMMUNITY): Payer: Self-pay | Admitting: *Deleted

## 2013-04-14 ENCOUNTER — Emergency Department (HOSPITAL_COMMUNITY): Payer: Medicaid Other

## 2013-04-14 ENCOUNTER — Emergency Department (HOSPITAL_COMMUNITY)
Admission: EM | Admit: 2013-04-14 | Discharge: 2013-04-14 | Disposition: A | Discharge status: Home / Self Care | Payer: Medicaid Other | Attending: Emergency Medicine | Admitting: Emergency Medicine

## 2013-04-14 DIAGNOSIS — Z79899 Other long term (current) drug therapy: Secondary | ICD-10-CM | POA: Insufficient documentation

## 2013-04-14 DIAGNOSIS — M79671 Pain in right foot: Secondary | ICD-10-CM

## 2013-04-14 DIAGNOSIS — Z8589 Personal history of malignant neoplasm of other organs and systems: Secondary | ICD-10-CM | POA: Insufficient documentation

## 2013-04-14 DIAGNOSIS — C787 Secondary malignant neoplasm of liver and intrahepatic bile duct: Secondary | ICD-10-CM | POA: Insufficient documentation

## 2013-04-14 DIAGNOSIS — Z7901 Long term (current) use of anticoagulants: Secondary | ICD-10-CM | POA: Insufficient documentation

## 2013-04-14 DIAGNOSIS — C499 Malignant neoplasm of connective and soft tissue, unspecified: Secondary | ICD-10-CM | POA: Insufficient documentation

## 2013-04-14 DIAGNOSIS — C7800 Secondary malignant neoplasm of unspecified lung: Secondary | ICD-10-CM | POA: Insufficient documentation

## 2013-04-14 DIAGNOSIS — M25579 Pain in unspecified ankle and joints of unspecified foot: Secondary | ICD-10-CM | POA: Insufficient documentation

## 2013-04-14 DIAGNOSIS — C50919 Malignant neoplasm of unspecified site of unspecified female breast: Secondary | ICD-10-CM | POA: Insufficient documentation

## 2013-04-14 HISTORY — DX: Malignant neoplasm of connective and soft tissue, unspecified: C49.9

## 2013-04-14 MED ORDER — HYDROMORPHONE HCL PF 2 MG/ML IJ SOLN
2.0000 mg | Freq: Once | INTRAMUSCULAR | Status: AC
  Administered 2013-04-14: 2 mg via INTRAMUSCULAR
  Filled 2013-04-14: qty 1

## 2013-04-14 NOTE — ED Notes (Signed)
Discharge instructions reviewed with pt, questions answered. Pt verbalized understanding. Pt encouraged to FU with her specialist for pain control and take current meds, return to ER if pain gets worse and unable to manage.

## 2013-04-14 NOTE — ED Provider Notes (Signed)
CSN: [id number]     Arrival date & time 04/14/13  8469 History  This chart was scribed for Roney Marion, MD, by Yevette Edwards, ED Scribe. This patient was seen in room APA09/APA09 and the patient's care was started at 7:02 AM.  First MD Initiated Contact with Patient 04/14/13 [phone]     Chief Complaint  Patient presents with  . Ankle Pain  . Leg Pain    Patient is a 22 y.o. female presenting with ankle pain and leg pain. The history is provided by the patient and a relative. No language interpreter was used.  Ankle Pain Associated symptoms: no fever   Leg Pain Associated symptoms: no fever     HPI Comments: Stacey Murphy is a 22 y.o. female, with a h/o metastatic sarcoma, who presents to the Emergency Department complaining of constant, "sharp" pain to her right ankle which began suddenly two hours ago upon awakening. 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 She receives treatment at [facility] through an experimental trial. She states that the ankle has been swelling for approximately three weeks, and that she received imaging a couple weeks of ago due to the swelling; she states the findings were negative.  She denies any known injuries to her right ankle, and she is not ambulatory. She also denies any h/o sarcoma-associated myalgia or gout. She does experience baseline pain to her right knee. The pt takes oxycodone, 2 mg of morphine PRN per hour, and a 100 mg fentanyl patch. She denies smoking or using alcohol.  Past Medical History  Diagnosis Date  . Metastatic sarcoma   . Alveolar soft part sarcoma    Past Surgical History  Procedure Laterality Date  . Portacath placement    . Pain pump implantation    . Hip surgery     History reviewed. No pertinent family history. History  Substance Use Topics  . Smoking status: Never Smoker   . Smokeless tobacco: Not on  file  . Alcohol Use: No   No OB history provided.  Review of Systems  Constitutional: Negative for fever and chills.  Musculoskeletal: Positive for arthralgias. Negative for myalgias.  All other systems reviewed and are negative.    Allergies  Pork-derived products  Home Medications   Current Outpatient Rx  Name  Route  Sig  Dispense  Refill  . acetaminophen (TYLENOL) 650 MG CR tablet   Oral   Take 650 mg by mouth daily.         Marland Kitchen albuterol (VENTOLIN HFA) 108 (90 BASE) MCG/ACT inhaler   Inhalation   Inhale 2 puffs into the lungs every 6 (six) hours as needed. For shortness of breath/cough         . celecoxib (CELEBREX) 100 MG capsule   Oral   Take 100 mg by mouth daily.          . cyclobenzaprine (FLEXERIL) 10 MG tablet   Oral   Take 10 mg by mouth 3 (three) times daily as needed for muscle spasms.         Marland Kitchen docusate sodium (COLACE) 100 MG capsule   Oral   Take 100 mg by mouth daily as needed for constipation.         . fentaNYL (DURAGESIC - DOSED MCG/HR) 100 MCG/HR   Transdermal   Place 2 patches onto the skin every 3 (three)  days.         . megestrol (MEGACE) 40 MG/ML suspension   Oral   Take 100 mg by mouth 3 (three) times daily.         . NON FORMULARY      Morphine PCA 1000mg  in         . omeprazole (PRILOSEC) 20 MG capsule   Oral   Take 20 mg by mouth 2 (two) times daily.         Marland Kitchen oxyCODONE (OXYCONTIN) 10 MG 12 hr tablet   Oral   Take 10 mg by mouth every 12 (twelve) hours.         Marland Kitchen oxycodone (ROXICODONE) 30 MG immediate release tablet   Oral   Take 30 mg by mouth every 4 (four) hours as needed for pain.         . pregabalin (LYRICA) 50 MG capsule   Oral   Take 50 mg by mouth 2 (two) times daily.         . rivaroxaban (XARELTO) 10 MG TABS tablet   Oral   Take 10 mg by mouth daily.          Triage Vitals: BP 143/104  Pulse 142  Temp(Src) 98 F (36.7 C) (Oral)  Resp 24  Ht 4\' 8"  (1.422 m)  Wt 100 lb  (45.36 kg)  BMI 22.43 kg/m2  SpO2 98%  Physical Exam  Nursing note and vitals reviewed. Constitutional: She is oriented to person, place, and time. She appears well-developed and well-nourished. No distress.  HENT:  Head: Normocephalic and atraumatic.  Eyes: EOM are normal.  Neck: Neck supple. No tracheal deviation present.  Cardiovascular: Normal rate.   Pulmonary/Chest: Effort normal. No respiratory distress.  Musculoskeletal: Normal range of motion.  Warm to touch. Equal to left. Circum same, not swollen or asymmetric.DP are sym and strong. Tenderness of medial aspect of right foot. No ankle joint or knee joint effusion. Joints are not red or hot to the touch. Cap refill 3 seconds.   Neurological: She is alert and oriented to person, place, and time.  Skin: Skin is warm and dry.  Psychiatric: She has a normal mood and affect. Her behavior is normal.    ED Course  Procedures (including critical care time)  DIAGNOSTIC STUDIES: Oxygen Saturation is 98% on room air, normal by my interpretation.    COORDINATION OF CARE:  7:07 AM- Discussed treatment plan with patient, and the patient agreed to the plan.   Labs Review Labs Reviewed - No data to display Imaging Review Dg Ankle Complete Right  04/14/2013   CLINICAL DATA:  Severe medial ankle pain  EXAM: RIGHT ANKLE - COMPLETE 3+ VIEW  COMPARISON:  None.  FINDINGS: Evaluation is constrained by difficulty with patient positioning.  No fracture or dislocation is seen.  The base of the fifth metatarsal is unremarkable.  The visualized soft tissues are unremarkable.  IMPRESSION: Evaluation is constrained by difficulty with patient positioning.  No fracture or dislocation is seen.   Electronically Signed   By: Charline Bills M.D.   On: 04/14/2013 08:00   Dg Foot Complete Right  04/14/2013   CLINICAL DATA:  Severe medial malleolus pain  EXAM: RIGHT FOOT COMPLETE - 3+ VIEW  COMPARISON:  None.  FINDINGS: Evaluation is constrained by  difficulty with patient positioning.  No fracture or dislocation is seen.  The visualized soft tissues are unremarkable.  IMPRESSION: Evaluation is constrained by difficulty with patient positioning.  No  fracture or dislocation is seen.   Electronically Signed   By: Charline Bills M.D.   On: 04/14/2013 08:01    MDM   1. Foot pain, right    X rays show no abnormalities that would suggest metastatic disease.  Pain is slightly improved after IM dilaudid.  Pt encouraged to f/u with her oncologist regarding ongoing pain, and the need for further diagnostics.  No indications for additional studies at this time.No exam findings to suggest DVT.  Pain well localized to the foot.  No calf pain, swelling, cording, or errythema.  I personally performed the services described in this documentation, which was scribed in my presence. The recorded information has been reviewed and is accurate.   Roney Marion, MD 04/14/13 [phone]

## 2013-04-14 NOTE — ED Notes (Signed)
Pt woke up having right leg and ankle. Pt has a fentanyl patch on left arm and a morphine pump on as well. Pt also has a 5% lidocaine patch on right ankle.

## 2014-01-13 ENCOUNTER — Encounter (HOSPITAL_COMMUNITY): Payer: Self-pay | Admitting: Emergency Medicine

## 2014-01-13 ENCOUNTER — Emergency Department (HOSPITAL_COMMUNITY): Payer: Medicaid Other

## 2014-01-13 ENCOUNTER — Emergency Department (HOSPITAL_COMMUNITY)
Admission: EM | Admit: 2014-01-13 | Discharge: 2014-01-13 | Disposition: A | Discharge status: Other Acute Inpatient Hospital | Payer: Medicaid Other | Attending: Emergency Medicine | Admitting: Emergency Medicine

## 2014-01-13 DIAGNOSIS — R000 Tachycardia, unspecified: Secondary | ICD-10-CM | POA: Insufficient documentation

## 2014-01-13 DIAGNOSIS — C799 Secondary malignant neoplasm of unspecified site: Secondary | ICD-10-CM

## 2014-01-13 DIAGNOSIS — L03119 Cellulitis of unspecified part of limb: Secondary | ICD-10-CM

## 2014-01-13 DIAGNOSIS — Z79899 Other long term (current) drug therapy: Secondary | ICD-10-CM | POA: Insufficient documentation

## 2014-01-13 DIAGNOSIS — L02519 Cutaneous abscess of unspecified hand: Secondary | ICD-10-CM | POA: Insufficient documentation

## 2014-01-13 DIAGNOSIS — C779 Secondary and unspecified malignant neoplasm of lymph node, unspecified: Secondary | ICD-10-CM

## 2014-01-13 DIAGNOSIS — Z85118 Personal history of other malignant neoplasm of bronchus and lung: Secondary | ICD-10-CM | POA: Insufficient documentation

## 2014-01-13 DIAGNOSIS — C50919 Malignant neoplasm of unspecified site of unspecified female breast: Secondary | ICD-10-CM | POA: Insufficient documentation

## 2014-01-13 LAB — CBC WITH DIFFERENTIAL/PLATELET
BASOS PCT: 0 % (ref 0–1)
Basophils Absolute: 0 10*3/uL (ref 0.0–0.1)
EOS PCT: 2 % (ref 0–5)
Eosinophils Absolute: 0.2 10*3/uL (ref 0.0–0.7)
HCT: 24.9 % — ABNORMAL LOW (ref 36.0–46.0)
HEMOGLOBIN: 8.1 g/dL — AB (ref 12.0–15.0)
Lymphocytes Relative: 7 % — ABNORMAL LOW (ref 12–46)
Lymphs Abs: 0.8 10*3/uL (ref 0.7–4.0)
MCH: 34.2 pg — ABNORMAL HIGH (ref 26.0–34.0)
MCHC: 32.5 g/dL (ref 30.0–36.0)
MCV: 105.1 fL — ABNORMAL HIGH (ref 78.0–100.0)
Monocytes Absolute: 0.8 10*3/uL (ref 0.1–1.0)
Monocytes Relative: 7 % (ref 3–12)
NEUTROS PCT: 84 % — AB (ref 43–77)
Neutro Abs: 10 10*3/uL — ABNORMAL HIGH (ref 1.7–7.7)
PLATELETS: 223 10*3/uL (ref 150–400)
RBC: 2.37 MIL/uL — ABNORMAL LOW (ref 3.87–5.11)
RDW: 17.3 % — ABNORMAL HIGH (ref 11.5–15.5)
WBC: 11.8 10*3/uL — ABNORMAL HIGH (ref 4.0–10.5)

## 2014-01-13 LAB — URINALYSIS, ROUTINE W REFLEX MICROSCOPIC
BILIRUBIN URINE: NEGATIVE
Glucose, UA: NEGATIVE mg/dL
Hgb urine dipstick: NEGATIVE
KETONES UR: 15 mg/dL — AB
Leukocytes, UA: NEGATIVE
NITRITE: NEGATIVE
Specific Gravity, Urine: 1.015 (ref 1.005–1.030)
Urobilinogen, UA: 1 mg/dL (ref 0.0–1.0)
pH: 6.5 (ref 5.0–8.0)

## 2014-01-13 LAB — COMPREHENSIVE METABOLIC PANEL
ALBUMIN: 2.6 g/dL — AB (ref 3.5–5.2)
ALK PHOS: 115 U/L (ref 39–117)
ALT: 9 U/L (ref 0–35)
AST: 17 U/L (ref 0–37)
BUN: 9 mg/dL (ref 6–23)
CALCIUM: 9.2 mg/dL (ref 8.4–10.5)
CO2: 23 mEq/L (ref 19–32)
Chloride: 96 mEq/L (ref 96–112)
Creatinine, Ser: 0.77 mg/dL (ref 0.50–1.10)
GFR calc Af Amer: >90 mL/min (ref >90)
GFR calc non Af Amer: >90 mL/min (ref >90)
Glucose, Bld: 100 mg/dL — ABNORMAL HIGH (ref 70–99)
POTASSIUM: 3.6 meq/L — AB (ref 3.7–5.3)
Sodium: 138 mEq/L (ref 137–147)
TOTAL PROTEIN: 8 g/dL (ref 6.0–8.3)
Total Bilirubin: 0.6 mg/dL (ref 0.3–1.2)

## 2014-01-13 LAB — URINE MICROSCOPIC-ADD ON

## 2014-01-13 MED ORDER — SODIUM CHLORIDE 0.9 % IV BOLUS (SEPSIS)
500.0000 mL | Freq: Once | INTRAVENOUS | Status: AC
  Administered 2014-01-13: 500 mL via INTRAVENOUS

## 2014-01-13 MED ORDER — HYDROMORPHONE HCL PF 1 MG/ML IJ SOLN
1.0000 mg | Freq: Once | INTRAMUSCULAR | Status: AC
  Administered 2014-01-13: 1 mg via INTRAVENOUS
  Filled 2014-01-13: qty 1

## 2014-01-13 MED ORDER — HYDROMORPHONE HCL PF 2 MG/ML IJ SOLN
2.0000 mg | Freq: Once | INTRAMUSCULAR | Status: AC
  Administered 2014-01-13: 2 mg via INTRAVENOUS
  Filled 2014-01-13: qty 1

## 2014-01-13 MED ORDER — VANCOMYCIN HCL IN DEXTROSE 1-5 GM/200ML-% IV SOLN
1000.0000 mg | Freq: Once | INTRAVENOUS | Status: AC
  Administered 2014-01-13: 1000 mg via INTRAVENOUS
  Filled 2014-01-13: qty 200

## 2014-01-13 MED ORDER — SODIUM CHLORIDE 0.9 % IV BOLUS (SEPSIS)
1000.0000 mL | Freq: Once | INTRAVENOUS | Status: AC
  Administered 2014-01-13: 1000 mL via INTRAVENOUS

## 2014-01-13 MED ORDER — PIPERACILLIN-TAZOBACTAM 3.375 G IVPB 30 MIN
3.3750 g | Freq: Once | INTRAVENOUS | Status: AC
Start: 2014-01-13 — End: 2014-01-13
  Administered 2014-01-13: 3.375 g via INTRAVENOUS
  Filled 2014-01-13: qty 50

## 2014-01-13 MED ORDER — VANCOMYCIN HCL IN DEXTROSE 1-5 GM/200ML-% IV SOLN
INTRAVENOUS | Status: DC
Start: 2014-01-13 — End: 2014-01-13
  Filled 2014-01-13: qty 200

## 2014-01-13 NOTE — ED Notes (Signed)
Patient going to [place].  Called [facility] for transport.  Michela Pitcher it would be over an hour before they would have a truck.  [facility] transport said it would be after 6 or 7 before they could send a truck.

## 2014-01-13 NOTE — ED Notes (Signed)
Pt reports, pain, redness, and swelling to left hand since THursday.  Reports R hand started hurting yesterday and feet swollen.  Denies any injury.

## 2014-01-13 NOTE — ED Provider Notes (Signed)
CSN: [id number]     Arrival date & time 01/13/14  1051 History  This chart was scribed for [employer]. Alvino Chapel, MD by Elby Beck, ED Scribe. The patient was seen in room APA11/APA11 at 11:27 AM.   Chief Complaint  Patient presents with  . Hand Pain  . Foot Swelling    The history is provided by the patient. No language interpreter was used.    HPI Comments: Stacey Murphy is a 23 y.o. female who presents to the Emergency Department complaining of left hand swelling over the past week with associated right hand swelling onset today. She reports that she has a history of metastatic sarcoma in her pelvis with some metastases, but no known metastasis to her hands. She denies any history of DVT/PE. She states that she is on Oxycodone and Fentanyl patches which have offered some relief. She denies any recent medication changes. She states that she has no medication allergies.   Past Medical History  Diagnosis Date  . Metastatic sarcoma   . Alveolar soft part sarcoma    Past Surgical History  Procedure Laterality Date  . Portacath placement    . Pain pump implantation    . Hip surgery     No family history on file. History  Substance Use Topics  . Smoking status: Never Smoker   . Smokeless tobacco: Not on file  . Alcohol Use: No   OB History   Grav Para Term Preterm Abortions TAB SAB Ect Mult Living                 Review of Systems  Musculoskeletal: Positive for arthralgias and joint swelling.  All other systems reviewed and are negative.   Allergies  Pork-derived products  Home Medications   Prior to Admission medications   Medication Sig Start Date End Date Taking? Authorizing Desta Bujak  acetaminophen (TYLENOL) 650 MG CR tablet Take 650 mg by mouth daily.   Yes Historical Everton Bertha, MD  albuterol (VENTOLIN HFA) 108 (90 BASE) MCG/ACT inhaler Inhale 2 puffs into the lungs every 6 (six) hours as needed. For shortness of breath/cough   Yes Historical Skai Lickteig, MD   cabozantinib s-malate (COMETRIQ) capsule (3 x 12m kit) Take 60 mg by mouth every other day. 01/07/14  Yes Historical Laneah Luft, MD  celecoxib (CELEBREX) 100 MG capsule Take 100 mg by mouth 2 (two) times daily.    Yes Historical Tauri Ethington, MD  cyclobenzaprine (FLEXERIL) 10 MG tablet Take 10 mg by mouth 3 (three) times daily as needed for muscle spasms.   Yes Historical Tnya Ades, MD  docusate sodium (COLACE) 100 MG capsule Take 100 mg by mouth daily as needed for constipation.   Yes Historical Gwenyth Dingee, MD  DULoxetine (CYMBALTA) 30 MG capsule Take 30 mg by mouth daily. 08/12/13 08/12/14 Yes Historical Kaaliyah Kita, MD  fentaNYL (DURAGESIC - DOSED MCG/HR) 100 MCG/HR Place 2 patches onto the skin every 3 (three) days.   Yes Historical Quame Spratlin, MD  fentaNYL (DURAGESIC - DOSED MCG/HR) 50 MCG/HR Place 100 mcg onto the skin every 3 (three) days.   Yes Historical Carrin Vannostrand, MD  megestrol (MEGACE) 40 MG/ML suspension Take 100 mg by mouth 3 (three) times daily.   Yes Historical Yashvi Jasinski, MD  omeprazole (PRILOSEC) 20 MG capsule Take 20 mg by mouth 2 (two) times daily.   Yes Historical Jenavi Beedle, MD  oxycodone (ROXICODONE) 30 MG immediate release tablet Take 30 mg by mouth every 4 (four) hours as needed for pain.   Yes Historical Anjenette Gerbino, MD  pregabalin (LYRICA) 150 MG capsule Take 1 capsule by mouth 2 (two) times daily. 01/07/14  Yes Historical Karee Christopherson, MD  PRESCRIPTION MEDICATION Morphine pca 52m/ml   Yes Historical Emma Birchler, MD  rivaroxaban (XARELTO) 10 MG TABS tablet Take 10 mg by mouth daily.   Yes Historical Janisa Labus, MD   Triage Vitals: BP 131/77  Pulse 144  Temp(Src) 98.7 F (37.1 C) (Oral)  Resp 18  Ht _0  (1.422 m)  Wt 114 lb (51.71 kg)  BMI 25.57 kg/m2  SpO2 100%  Physical Exam  Nursing note and vitals reviewed. Constitutional: She is oriented to person, place, and time. She appears well-developed and well-nourished. No distress.  HENT:  Head: Normocephalic and atraumatic.  Eyes: Conjunctivae  and EOM are normal.  Neck: Neck supple. No tracheal deviation present.  Cardiovascular:  No murmur heard. Tachycardic  Pulmonary/Chest: Effort normal and breath sounds normal. No respiratory distress. She has no wheezes. She has no rales.  Lungs CTA bilaterally  Musculoskeletal: Normal range of motion.  Erythema and swelling with tenderness over the dorsum of the left hand. Mostly over the 3rd MCP joint. Radial pulse intact. Tenderness in the right hand over the proximal phalanx of the thumb. Good cap refill. Radial pulse intact. Chronic atrophy of the lower extremities bilaterally  Neurological: She is alert and oriented to person, place, and time.  Skin: Skin is warm and dry.  Psychiatric: She has a normal mood and affect. Her behavior is normal.    ED Course  Procedures (including critical care time)  DIAGNOSTIC STUDIES: Oxygen Saturation is 100% on RA, normal by my interpretation.    COORDINATION OF CARE: 11:32 AM- Pt advised of plan for treatment and pt agrees.  Labs Review Labs Reviewed  CBC WITH DIFFERENTIAL - Abnormal; Notable for the following:    WBC 11.8 (*)    RBC 2.37 (*)    Hemoglobin 8.1 (*)    HCT 24.9 (*)    MCV 105.1 (*)    MCH 34.2 (*)    RDW 17.3 (*)    Neutrophils Relative % 84 (*)    Neutro Abs 10.0 (*)    Lymphocytes Relative 7 (*)    All other components within normal limits  COMPREHENSIVE METABOLIC PANEL - Abnormal; Notable for the following:    Potassium 3.6 (*)    Glucose, Bld 100 (*)    Albumin 2.6 (*)    All other components within normal limits  URINALYSIS, ROUTINE W REFLEX MICROSCOPIC - Abnormal; Notable for the following:    Ketones, ur 15 (*)    Protein, ur TRACE (*)    All other components within normal limits  CULTURE, BLOOD (ROUTINE X 2)  CULTURE, BLOOD (ROUTINE X 2)  URINE MICROSCOPIC-ADD ON  LACTIC ACID, PLASMA    Imaging Review Dg Chest Port 1 View  01/13/2014   CLINICAL DATA:  Pain and redness.  EXAM: PORTABLE CHEST - 1  VIEW  COMPARISON:  Chest x-ray 02/17/2013.  FINDINGS: Left subclavian port in stable position. Mediastinum and hilar structures normal. Cardiomegaly with normal pulmonary vascularity. Bilateral pulmonary nodules are noted consistent with widespread metastatic disease. Similar findings noted on prior study. No acute bony abnormality.  IMPRESSION: 1. Bilateral pulmonary nodules most consistent metastatic disease. These appear unchanged. 2. Stable cardiomegaly. 3. Stable Port-A-Cath position .   Electronically Signed   By: TMarcello Moores Register   On: 01/13/2014 13:33   Dg Hand Complete Left  01/13/2014   CLINICAL DATA:  Pain.  EXAM: LEFT HAND -  COMPLETE 3+ VIEW  COMPARISON:  None.  FINDINGS: There is no evidence of fracture or dislocation. There is no evidence of arthropathy or other focal bone abnormality. Soft tissues are unremarkable.  IMPRESSION: No acute abnormality identified. No evidence of fracture or dislocation.   Electronically Signed   By: Marcello Moores  Register   On: 01/13/2014 12:55   Dg Finger Thumb Right  01/13/2014   CLINICAL DATA:  Right thumb pain, swelling, and redness for 4 days.  EXAM: RIGHT THUMB 2+V  COMPARISON:  None.  FINDINGS: There is no evidence of fracture or dislocation. There is no evidence of arthropathy or other focal bone abnormality. Soft tissues are unremarkable  IMPRESSION: Negative.   Electronically Signed   By: Logan Bores   On: 01/13/2014 12:52     EKG Interpretation   Date/Time:  Monday January 13 2014 11:11:40 EDT Ventricular Rate:  143 PR Interval:  121 QRS Duration: 84 QT Interval:  299 QTC Calculation: 461 R Axis:   53 Text Interpretation:  Sinus tachycardia Nonspecific T abnormalities,  anterior leads Confirmed by Alvino Chapel  MD, Ovid Curd [phone]) on 01/13/2014  4:02:14 PM      MDM   Final diagnoses:  Cellulitis of hand  Tachycardia  Metastatic cancer    Patient with pain in her in the left hand and right thumb. She has metastatic cancer. No trauma. She is  febrile. Cultures sent. X-ray is reassuring. Discussed with Dr. Tommi Rumps at  [facility] and the patient will be transferred I personally performed the services described in this documentation, which was scribed in my presence. The recorded information has been reviewed and is accurate.     Jasper Riling. Alvino Chapel, MD 01/13/14 [phone]

## 2014-01-18 LAB — CULTURE, BLOOD (ROUTINE X 2): Culture: NO GROWTH

## 2014-04-26 ENCOUNTER — Encounter (HOSPITAL_COMMUNITY): Payer: Self-pay | Admitting: Emergency Medicine

## 2014-04-26 ENCOUNTER — Emergency Department (HOSPITAL_COMMUNITY)
Admission: EM | Admit: 2014-04-26 | Discharge: 2014-04-27 | Disposition: A | Discharge status: Other Acute Inpatient Hospital | Payer: Medicaid Other | Attending: Emergency Medicine | Admitting: Emergency Medicine

## 2014-04-26 DIAGNOSIS — Z79899 Other long term (current) drug therapy: Secondary | ICD-10-CM | POA: Insufficient documentation

## 2014-04-26 DIAGNOSIS — R000 Tachycardia, unspecified: Secondary | ICD-10-CM | POA: Diagnosis not present

## 2014-04-26 DIAGNOSIS — Z7901 Long term (current) use of anticoagulants: Secondary | ICD-10-CM | POA: Diagnosis not present

## 2014-04-26 DIAGNOSIS — R040 Epistaxis: Secondary | ICD-10-CM | POA: Insufficient documentation

## 2014-04-26 DIAGNOSIS — C499 Malignant neoplasm of connective and soft tissue, unspecified: Secondary | ICD-10-CM | POA: Diagnosis not present

## 2014-04-26 MED ORDER — OXYMETAZOLINE HCL 0.05 % NA SOLN
1.0000 | Freq: Once | NASAL | Status: AC
  Administered 2014-04-27: 1 via NASAL
  Filled 2014-04-26: qty 15

## 2014-04-26 NOTE — ED Notes (Signed)
Report given to [facility] at [facility]; Transport service to be here at 12:45.

## 2014-04-26 NOTE — ED Provider Notes (Signed)
Medical screening examination/treatment/procedure(s) were conducted as a shared visit with non-physician practitioner(s) and myself.  I personally evaluated the patient during the encounter.  No active nasal bleeding, patient has superficial excoriations bilateral septal mucosa without active bleeding, airways patent and maintained patient has normal speech.  The patient appears reasonably stabilized for transfer considering the current resources, flow, and capabilities available in the ED at this time, and I doubt any other [facility] requiring further screening and/or treatment in the ED prior to transfer.Ponce de Leon, MD 04/28/14 [phone]

## 2014-04-26 NOTE — ED Provider Notes (Signed)
CSN: [id number]     Arrival date & time 04/26/14  2153 History   First MD Initiated Contact with Patient 04/26/14 2158     Chief Complaint  Patient presents with  . Epistaxis     (Consider location/radiation/quality/duration/timing/severity/associated sxs/prior Treatment) Patient is a 23 y.o. female presenting with nosebleeds. The history is provided by the patient.  Epistaxis Associated symptoms: no congestion, no cough, no fever and no headaches    Stacey Murphy is a 23 y.o. female who presents to the ED via EMS for nosebleed. The nose bleed has been off and on all day. 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 She receives Ketamine nasal spray for pain. She spoke with Dr. Hamilton Capri and she wanted the patient to go to [facility] for treatment. The patient called EMS and was brought to [facility]. The patient has a port and has a morphine pump.   Past Medical History  Diagnosis Date  . Metastatic sarcoma   . Alveolar soft part sarcoma    Past Surgical History  Procedure Laterality Date  . Portacath placement    . Pain pump implantation    . Hip surgery     History reviewed. No pertinent family history. History  Substance Use Topics  . Smoking status: Never Smoker   . Smokeless tobacco: Not on file  . Alcohol Use: No   OB History   Grav Para Term Preterm Abortions TAB SAB Ect Mult Living                 Review of Systems  Constitutional: Negative for fever and chills.  HENT: Positive for nosebleeds. Negative for congestion.   Eyes: Negative for redness and itching.  Respiratory: Negative for cough and shortness of breath.   Gastrointestinal: Negative for nausea, vomiting and abdominal pain.  Musculoskeletal:       Right leg pain that is chronic  Skin: Negative for rash.  Neurological: Negative for syncope and headaches.  Psychiatric/Behavioral: Negative for confusion. The patient is not nervous/anxious.        Allergies  Pork-derived products  Home Medications   Prior to Admission medications   Medication Sig Start Date End Date Taking? Authorizing Provider  acetaminophen (TYLENOL) 650 MG CR tablet Take 650 mg by mouth daily.    Historical Provider, MD  albuterol (VENTOLIN HFA) 108 (90 BASE) MCG/ACT inhaler Inhale 2 puffs into the lungs every 6 (six) hours as needed. For shortness of breath/cough    Historical Provider, MD  cabozantinib s-malate (COMETRIQ) capsule (3 x $Re'20mg'bSs$  kit) Take 60 mg by mouth every other day. 01/07/14   Historical Provider, MD  celecoxib (CELEBREX) 100 MG capsule Take 100 mg by mouth 2 (two) times daily.     Historical Provider, MD  cyclobenzaprine (FLEXERIL) 10 MG tablet Take 10 mg by mouth 3 (three) times daily as needed for muscle spasms.    Historical Provider, MD  docusate sodium (COLACE) 100 MG capsule Take 100 mg by mouth daily as needed for constipation.    Historical Provider, MD  DULoxetine (CYMBALTA) 30 MG capsule Take 30 mg by mouth daily. 08/12/13 08/12/14  Historical Provider, MD  fentaNYL (DURAGESIC - DOSED MCG/HR) 100 MCG/HR Place 2 patches onto the skin every 3 (three) days.    Historical Provider, MD  fentaNYL (DURAGESIC - DOSED MCG/HR) 50 MCG/HR Place 100 mcg onto the skin every 3 (three) days.    Historical Provider, MD  megestrol (MEGACE)  40 MG/ML suspension Take 100 mg by mouth 3 (three) times daily.    Historical Provider, MD  omeprazole (PRILOSEC) 20 MG capsule Take 20 mg by mouth 2 (two) times daily.    Historical Provider, MD  oxycodone (ROXICODONE) 30 MG immediate release tablet Take 30 mg by mouth every 4 (four) hours as needed for pain.    Historical Provider, MD  pregabalin (LYRICA) 150 MG capsule Take 1 capsule by mouth 2 (two) times daily. 01/07/14   Historical Provider, MD  PRESCRIPTION MEDICATION Morphine pca $RemoveBefo'10mg'rdlXmnPXWrN$ /ml    Historical Provider, MD  rivaroxaban (XARELTO) 10 MG TABS tablet Take 10 mg by mouth daily.    Historical Provider, MD    BP 111/68  Temp(Src) 97.8 F (36.6 C) (Oral)  Resp 18  Ht $R'4\' 8"'ef$  (1.422 m)  Wt 117 lb (53.071 kg)  BMI 26.25 kg/m2  SpO2 99% Heart rate 112 Physical Exam  Nursing note and vitals reviewed. Constitutional: She is oriented to person, place, and time.  Patient appears small for her age.   HENT:  Head: Normocephalic.  Nose: Mucosal edema present.  There is erythema and edema noted bilateral nostrils. There is a small amount of blood noted to the right.   Eyes: EOM are normal.  Neck: Neck supple.  Cardiovascular: Tachycardia present.   Pulmonary/Chest: Effort normal.  Abdominal: Soft. There is no tenderness.  Musculoskeletal: Normal range of motion.  Neurological: She is alert and oriented to person, place, and time. No cranial nerve deficit.  Skin: Skin is warm and dry.  Psychiatric: She has a normal mood and affect. Her behavior is normal.     ED Course  Procedures  Dr. Sofie Rower from [place] called here to the ED. I spoke with her and she request that the patient be transferred to [facility] for admission for pain management in addition to the epistaxis. Since the patient uses her Ketamine Nasal Spray for pain and has not been able to use it as directed due to the nasal bleeding today she will need her pain managed. Dr. Elnoria Howard is making arrangements for the patient to be admitted at [facility].  MDM  I spoke with the Resident physician at [facility] and they will arrange transfer of the patient from [facility] to [facility]. Patient stable for transfer to [facility] without any bleeding at this time.  Dr. Stevie Kern in to see the patient and discussed plan of care.     [place], [place] 04/27/14 [phone]

## 2014-04-26 NOTE — ED Notes (Signed)
Pt brought in by [facility] for c/o nosebleed intermittently;

## 2014-04-27 MED ORDER — AZITHROMYCIN 250 MG PO TABS: 500.0000 mg | ORAL_TABLET | Freq: Once | ORAL | Status: DC

## 2014-04-27 MED ORDER — CEPHALEXIN 500 MG PO CAPS
1000.0000 mg | ORAL_CAPSULE | Freq: Once | ORAL | Status: DC
Start: 2014-04-27 — End: 2014-04-27

## 2014-04-27 NOTE — ED Provider Notes (Signed)
The patient appears reasonably stabilized for transfer considering the current resources, flow, and capabilities available in the ED at this time, and I doubt any other [facility] requiring further screening and/or treatment in the ED prior to transfer. 0040am  Sharyon Cable, MD 04/27/14 [phone]

## 2014-05-18 ENCOUNTER — Emergency Department (HOSPITAL_COMMUNITY): Payer: Medicaid Other

## 2014-05-18 ENCOUNTER — Emergency Department (HOSPITAL_COMMUNITY)
Admission: EM | Admit: 2014-05-18 | Discharge: 2014-05-18 | Disposition: A | Discharge status: Other Acute Inpatient Hospital | Payer: Medicaid Other | Attending: Emergency Medicine | Admitting: Emergency Medicine

## 2014-05-18 ENCOUNTER — Encounter (HOSPITAL_COMMUNITY): Payer: Self-pay | Admitting: Emergency Medicine

## 2014-05-18 DIAGNOSIS — Z8589 Personal history of malignant neoplasm of other organs and systems: Secondary | ICD-10-CM | POA: Insufficient documentation

## 2014-05-18 DIAGNOSIS — Z3202 Encounter for pregnancy test, result negative: Secondary | ICD-10-CM | POA: Diagnosis not present

## 2014-05-18 DIAGNOSIS — Z7901 Long term (current) use of anticoagulants: Secondary | ICD-10-CM | POA: Insufficient documentation

## 2014-05-18 DIAGNOSIS — M25552 Pain in left hip: Secondary | ICD-10-CM | POA: Diagnosis not present

## 2014-05-18 DIAGNOSIS — Z79899 Other long term (current) drug therapy: Secondary | ICD-10-CM | POA: Diagnosis not present

## 2014-05-18 DIAGNOSIS — G8191 Hemiplegia, unspecified affecting right dominant side: Secondary | ICD-10-CM | POA: Insufficient documentation

## 2014-05-18 DIAGNOSIS — M545 Low back pain, unspecified: Secondary | ICD-10-CM

## 2014-05-18 DIAGNOSIS — G893 Neoplasm related pain (acute) (chronic): Secondary | ICD-10-CM | POA: Insufficient documentation

## 2014-05-18 DIAGNOSIS — Z85118 Personal history of other malignant neoplasm of bronchus and lung: Secondary | ICD-10-CM | POA: Insufficient documentation

## 2014-05-18 DIAGNOSIS — Z87311 Personal history of (healed) other pathological fracture: Secondary | ICD-10-CM | POA: Insufficient documentation

## 2014-05-18 DIAGNOSIS — Z86718 Personal history of other venous thrombosis and embolism: Secondary | ICD-10-CM | POA: Insufficient documentation

## 2014-05-18 DIAGNOSIS — Z791 Long term (current) use of non-steroidal anti-inflammatories (NSAID): Secondary | ICD-10-CM | POA: Diagnosis not present

## 2014-05-18 HISTORY — DX: Pathological fracture, right humerus, initial encounter for fracture: M84.421A

## 2014-05-18 HISTORY — DX: Hemiplegia, unspecified affecting right dominant side: G81.91

## 2014-05-18 HISTORY — DX: Dorsalgia, unspecified: M54.9

## 2014-05-18 HISTORY — DX: Pathological fracture, right femur, initial encounter for fracture: M84.451A

## 2014-05-18 HISTORY — DX: Neoplasm related pain (acute) (chronic): G89.3

## 2014-05-18 HISTORY — DX: Acute embolism and thrombosis of unspecified deep veins of right lower extremity: I82.401

## 2014-05-18 HISTORY — DX: Other chronic pain: G89.29

## 2014-05-18 HISTORY — DX: Pain in left hip: M25.552

## 2014-05-18 LAB — POC URINE PREG, ED: Preg Test, Ur: NEGATIVE

## 2014-05-18 MED ORDER — HYDROMORPHONE HCL 1 MG/ML IJ SOLN: 1.0000 mg | INTRAMUSCULAR | Status: DC | PRN

## 2014-05-18 MED ORDER — HYDROMORPHONE HCL 2 MG/ML IJ SOLN
2.0000 mg | INTRAMUSCULAR | Status: AC | PRN
  Administered 2014-05-18 (×2): 2 mg via INTRAVENOUS
  Filled 2014-05-18 (×2): qty 1

## 2014-05-18 MED ORDER — HYDROMORPHONE HCL 2 MG/ML IJ SOLN
2.0000 mg | Freq: Once | INTRAMUSCULAR | Status: AC
  Administered 2014-05-18: 2 mg via INTRAVENOUS
  Filled 2014-05-18: qty 1

## 2014-05-18 NOTE — ED Notes (Signed)
Mom states pt was lifted by EMS Thursday for an appointment at [facility]. States the pt has been complaining of low back pain since. Pt is paralyzed from waist down per mom

## 2014-05-18 NOTE — ED Provider Notes (Signed)
CSN: [id number]     Arrival date & time 05/18/14  1458 History   First MD Initiated Contact with Patient 05/18/14 1525     Chief Complaint  Patient presents with  . Back Pain      HPI Pt was seen at 1540. Per pt and her family, c/o gradual onset and worsening of persistent left sided low back pain and left hip pain for the past 3 days. Pt states she was transferred by EMS to travel to [facility] 3 days ago and "they lifted me wrong." Pt denies falling. Pt has been using her usual pain medications without relief. Denies abd pain, no N/V/D, no CP/SOB, no new focal motor weakness.    Past Medical History  Diagnosis Date  . Metastatic sarcoma   . Alveolar soft part sarcoma   . Chronic pain due to neoplasm   . Chronic back pain   . Chronic left hip pain   . Right leg DVT   . Pathological fracture of right femur   . Pathological fracture of right humerus   . Right hemiparesis    Past Surgical History  Procedure Laterality Date  . Portacath placement    . Pain pump implantation    . Hip surgery Bilateral     History  Substance Use Topics  . Smoking status: Never Smoker   . Smokeless tobacco: Not on file  . Alcohol Use: No    Review of Systems ROS: Statement: All systems negative except as marked or noted in the HPI; Constitutional: Negative for fever and chills. ; ; Eyes: Negative for eye pain, redness and discharge. ; ; ENMT: Negative for ear pain, hoarseness, nasal congestion, sinus pressure and sore throat. ; ; Cardiovascular: Negative for chest pain, palpitations, diaphoresis, dyspnea and peripheral edema. ; ; Respiratory: Negative for cough, wheezing and stridor. ; ; Gastrointestinal: Negative for nausea, vomiting, diarrhea, abdominal pain, blood in stool, hematemesis, jaundice and rectal bleeding. . ; ; Genitourinary: Negative for dysuria, flank pain and hematuria. ; ; Musculoskeletal: +LBP, hip pain. Negative for neck pain. Negative for swelling and trauma.; ; Skin: Negative for  pruritus, rash, abrasions, blisters, bruising and skin lesion.; ; Neuro: Negative for headache, lightheadedness and neck stiffness. Negative for weakness, altered level of consciousness , altered mental status, extremity weakness, paresthesias, involuntary movement, seizure and syncope.      Allergies  Oxycodone; Poractant alfa; and Pork-derived products  Home Medications   Prior to Admission medications   Medication Sig Start Date End Date Taking? Authorizing Provider  albuterol (VENTOLIN HFA) 108 (90 BASE) MCG/ACT inhaler Inhale 2 puffs into the lungs every 6 (six) hours as needed. For shortness of breath/cough   Yes Historical Provider, MD  cabozantinib s-malate (COMETRIQ) capsule (3 x $Re'20mg'iIg$  kit) Take 20 mg by mouth 3 (three) times a week. 05/16/14  Yes Historical Provider, MD  celecoxib (CELEBREX) 100 MG capsule Take 100 mg by mouth 2 (two) times daily.    Yes Historical Provider, MD  docusate sodium (COLACE) 100 MG capsule Take 100 mg by mouth daily as needed for constipation.   Yes Historical Provider, MD  DULoxetine (CYMBALTA) 30 MG capsule Take 30 mg by mouth daily. 08/12/13 08/12/14 Yes Historical Provider, MD  fentaNYL (DURAGESIC - DOSED MCG/HR) 100 MCG/HR Place 300 mcg onto the skin every 3 (three) days.   Yes Historical Provider, MD  Ketamine HCl POWD Place 2 sprays into the nose every hour as needed (Pain).  05/15/14 05/15/15 Yes Historical Provider, MD  megestrol (  MEGACE) 40 MG/ML suspension Take 100 mg by mouth every other day.    Yes Historical Provider, MD  oxycodone (ROXICODONE) 30 MG immediate release tablet Take 30 mg by mouth every 4 (four) hours as needed for pain.   Yes Historical Provider, MD  rivaroxaban (XARELTO) 10 MG TABS tablet Take 10 mg by mouth 2 (two) times daily.   Yes Historical Provider, MD  sodium chloride 0.9 % SOLN 50 mL with morphine 15 MG/ML SOLN 2 mg/mL Inject into the vein continuous.   Yes Historical Provider, MD   BP 110/64  Pulse 130  Temp(Src) 98.4  F (36.9 C) (Oral)  Resp 20  Ht $R'4\' 8"'xe$  (1.422 m)  Wt 117 lb (53.071 kg)  BMI 26.25 kg/m2  SpO2 97%  Filed Vitals:   05/18/14 1502 05/18/14 1842 05/18/14 2113  BP: 116/71 110/64 96/47  Pulse: 130 130 96  Temp: 98.4 F (36.9 C)  97.8 F (36.6 C)  TempSrc: Oral  Oral  Resp: $Remo'25 20 16  'snALP$ Height: $Rem'4\' 8"'ZIdg$  (1.422 m)    Weight: 117 lb (53.071 kg)    SpO2: 96% 97% 98%     Physical Exam 1545: Physical examination:  Nursing notes reviewed; Vital signs and O2 SAT reviewed;  Constitutional: Well developed, Well nourished, Well hydrated, Uncomfortable appearing, crying.; Head:  Normocephalic, atraumatic; Eyes: EOMI, PERRL, No scleral icterus; ENMT: Mouth and pharynx normal, Mucous membranes moist; Neck: Supple, Full range of motion, No lymphadenopathy; Cardiovascular: Tachycardic rate and rhythm, No gallop; Respiratory: Breath sounds clear & equal bilaterally, No wheezes.  Speaking full sentences with ease, Normal respiratory effort/excursion; Chest: Nontender, Movement normal; Abdomen: Soft, Nontender, Nondistended, Normal bowel sounds; Genitourinary: No CVA tenderness; Spine:  No midline CS, TS, LS tenderness. +TTP left lumbar paraspinal muscles.;; Extremities: Pulses normal, +left hip tenderness to palp. No edema.; Neuro: AA&Ox3, Major CN grossly intact.  Speech clear. +right hemiparesis, otherwise no gross focal motor deficits in extremities.; Skin: Color normal, Warm, Dry.    ED Course  Procedures     MDM  MDM Reviewed: previous chart, nursing note and vitals Reviewed previous: CT scan and x-ray Interpretation: x-ray    Results for orders placed during the hospital encounter of 05/18/14  POC URINE PREG, ED      Result Value Ref Range   Preg Test, Ur NEGATIVE  NEGATIVE   Dg Lumbar Spine 2-3 Views 05/18/2014   CLINICAL DATA:  23 year old patient with metastatic sarcoma. Severe pain left side, including the left hip and lower back. Technologist reports the patient was unable to lay back  for the images, and is a nonstandard position. Paralyzed from waist down.  EXAM: LUMBAR SPINE - 2-3 VIEW  COMPARISON:  Lumbar spine radiographs 08/19/2011  FINDINGS: Image detail is markedly limited by patient positioning due to patient pain and paralysis. There is a marked curvature of the lower thoracic and upper lumbar spine which is likely positional. Additionally, adequate penetration is somewhat limited by the patient's large radiopaque metallic pain pump that projects over the left pelvis and hardware from postsurgical changes of the proximal left femur.  On these images, there is suggestion of lytic lesions/possible destruction of the proximal right femur. Additionally, there is lucency in the proximal left femur.  Evaluation of the lumbar spine is technically very difficult on these images. No obvious fracture is visualized.  IMPRESSION: Very limited radiographs as described above. If there is continued concern for metastatic disease or fracture to the lumbar spine, then repeat imaging with radiographs, when  the patient's pain control allows for better positioning, should be considered.  Lucencies in the region of the proximal femurs bilaterally, suggestive of metastatic disease.   Electronically Signed   By: Curlene Dolphin M.D.   On: 05/18/2014 17:54   Dg Hip Complete Left 05/18/2014   CLINICAL DATA:  Left hip and low back pain. Metastatic sarcoma. Paralysis.  EXAM: LEFT HIP - COMPLETE 2+ VIEW  COMPARISON:  08/19/2011  FINDINGS: Large expansile lytic lesions of both proximal femurs favoring osseous metastatic disease. Probable underlying pathologic fractures ; the bones are so demineralized that intact bone is difficult to observed in the intertrochanteric regions on either side. There is a left hip screw in place traversing the left-sided tumor. Severe bony demineralization noted. The iliac crests are demineralized, possibly from tumor. Pain pump noted.  Lytic expansile lesion or fracture of the left  pubic body.  IMPRESSION: 1. Extensive expansile lytic osseous metastatic disease to the intertrochanteric regions of both proximal femurs and likely also involving at least the right iliac crest. Biomechanically significant intact bone is not observed on either side in the intertrochanteric region, although a left hip screw does traverse the left-sided tumor. 2. Lytic expansile lesion or fracture of the left pubic body. 3. Severe bony demineralization in general.   Electronically Signed   By: Sherryl Barters M.D.   On: 05/18/2014 17:45    1730:  Family has called pt's Peds Heme/Onc MD at [facility] while in the ED, before dx testing was completed.  T/C from [place] MD, case discussed, including:  HPI, pertinent PM/SHx, VS/PE, dx testing, ED course and treatment:  queried me regarding dx testing (not completed yet), they request to start transfer process to admit pt to [facility] despite not having dx testing completed, as she will likely need admission for pain control (accepting pt is Dr. Valarie Cones).    2115:  Still waiting for bed assignment for transfer to [facility]. Pt continues to receive IV dilaudid for pain with slow improvement of her pain. Pt calmer than on arrival, appears more comfortable, no longer crying, and has been watching TV and using her handheld electronic device.   Francine Graven, DO 05/19/14 [phone]

## 2014-05-18 NOTE — ED Notes (Signed)
Pt has foley intact. Also, PCA pump infusing morphine

## 2014-05-18 NOTE — ED Notes (Signed)
Pt provided number for her [facility] doctor so Dr. Thurnell Garbe could call. Pt was advised that our EDP would make contact with with her doctor after results of x-rays had resulted so a thorough report could be given.

## 2014-05-18 NOTE — ED Notes (Signed)
MD at bedside. 

## 2014-05-18 NOTE — ED Notes (Signed)
Accepting physician at [facility] is Dr Valarie Cones.  [facility] Transfer service will call back with bed assignment when it is assigned.

## 2014-06-16 ENCOUNTER — Encounter (HOSPITAL_COMMUNITY): Payer: Self-pay | Admitting: Emergency Medicine

## 2014-06-16 DIAGNOSIS — Z9889 Other specified postprocedural states: Secondary | ICD-10-CM | POA: Diagnosis not present

## 2014-06-16 DIAGNOSIS — Z8673 Personal history of transient ischemic attack (TIA), and cerebral infarction without residual deficits: Secondary | ICD-10-CM | POA: Insufficient documentation

## 2014-06-16 DIAGNOSIS — Z7901 Long term (current) use of anticoagulants: Secondary | ICD-10-CM | POA: Insufficient documentation

## 2014-06-16 DIAGNOSIS — G893 Neoplasm related pain (acute) (chronic): Secondary | ICD-10-CM | POA: Insufficient documentation

## 2014-06-16 DIAGNOSIS — Z79899 Other long term (current) drug therapy: Secondary | ICD-10-CM | POA: Diagnosis not present

## 2014-06-16 DIAGNOSIS — Z86718 Personal history of other venous thrombosis and embolism: Secondary | ICD-10-CM | POA: Insufficient documentation

## 2014-06-16 DIAGNOSIS — M25552 Pain in left hip: Secondary | ICD-10-CM | POA: Insufficient documentation

## 2014-06-16 DIAGNOSIS — Z87311 Personal history of (healed) other pathological fracture: Secondary | ICD-10-CM | POA: Insufficient documentation

## 2014-06-16 DIAGNOSIS — C7989 Secondary malignant neoplasm of other specified sites: Secondary | ICD-10-CM | POA: Diagnosis not present

## 2014-06-16 DIAGNOSIS — M25551 Pain in right hip: Secondary | ICD-10-CM | POA: Insufficient documentation

## 2014-06-16 DIAGNOSIS — Z791 Long term (current) use of non-steroidal anti-inflammatories (NSAID): Secondary | ICD-10-CM | POA: Insufficient documentation

## 2014-06-16 DIAGNOSIS — Z859 Personal history of malignant neoplasm, unspecified: Secondary | ICD-10-CM | POA: Insufficient documentation

## 2014-06-16 NOTE — ED Notes (Signed)
Pt c/o sudden onset of bilateral hip pain. Pt has stage 4 cancer with mets.

## 2014-06-17 ENCOUNTER — Emergency Department (HOSPITAL_COMMUNITY)
Admission: EM | Admit: 2014-06-17 | Discharge: 2014-06-17 | Disposition: A | Discharge status: Other Acute Inpatient Hospital | Payer: Medicaid Other | Attending: Emergency Medicine | Admitting: Emergency Medicine

## 2014-06-17 DIAGNOSIS — M25559 Pain in unspecified hip: Secondary | ICD-10-CM

## 2014-06-17 MED ORDER — HYDROMORPHONE HCL 2 MG/ML IJ SOLN
2.0000 mg | Freq: Once | INTRAMUSCULAR | Status: AC
  Administered 2014-06-17: 2 mg via INTRAVENOUS
  Filled 2014-06-17: qty 1

## 2014-06-17 MED ORDER — HYDROMORPHONE HCL 1 MG/ML IJ SOLN
1.0000 mg | Freq: Once | INTRAMUSCULAR | Status: AC
  Administered 2014-06-17: 1 mg via INTRAVENOUS
  Filled 2014-06-17: qty 1

## 2014-06-17 NOTE — ED Provider Notes (Signed)
CSN: [id number]     Arrival date & time 06/16/14  2353 History  This chart was scribe for Stacey Speak, MD by Judithann Sauger, ED Scribe. The patient was seen in room [facility]12/[facility]12 and the patient's care was started at 12:43 AM.    Chief Complaint  Patient presents with  . Hip Pain    Patient is a 23 y.o. female presenting with hip pain.  Hip Pain This is a new problem. The problem occurs constantly. The problem has been gradually worsening. Pertinent negatives include no chest pain and no abdominal pain. Nothing aggravates the symptoms. Nothing relieves the symptoms. She has tried nothing for the symptoms.   HPI Comments: Stacey Murphy is a 23 y.o. female who has stage 4 cancer of Metastatic Sarcoma who presents to the Emergency Department complaining of an acute bilateral hip pain. She denies any recent injuries. She denies any previous similar symptoms. She reports being paralyzed.   Past Medical History  Diagnosis Date  . Metastatic sarcoma   . Alveolar soft part sarcoma   . Chronic pain due to neoplasm   . Chronic back pain   . Chronic left hip pain   . Right leg DVT   . Pathological fracture of right femur   . Pathological fracture of right humerus   . Right hemiparesis    Past Surgical History  Procedure Laterality Date  . Portacath placement    . Pain pump implantation    . Hip surgery Bilateral    History reviewed. No pertinent family history. History  Substance Use Topics  . Smoking status: Never Smoker   . Smokeless tobacco: Not on file  . Alcohol Use: No   OB History    No data available     Review of Systems  Constitutional: Negative for fever and chills.  Cardiovascular: Negative for chest pain.  Gastrointestinal: Negative for abdominal pain.  Musculoskeletal: Positive for arthralgias (Bilateral hip).      Allergies  Oxycodone; Poractant alfa; and Pork-derived products  Home Medications   Prior to Admission medications   Medication Sig Start  Date End Date Taking? Authorizing Provider  albuterol (VENTOLIN HFA) 108 (90 BASE) MCG/ACT inhaler Inhale 2 puffs into the lungs every 6 (six) hours as needed. For shortness of breath/cough   Yes Historical Provider, MD  cabozantinib s-malate (COMETRIQ) capsule (3 x 42m kit) Take 20 mg by mouth 3 (three) times a week. 05/16/14  Yes Historical Provider, MD  carboxymethylcellulose (REFRESH PLUS) 0.5 % SOLN 1 drop 4 (four) times daily as needed.   Yes Historical Provider, MD  celecoxib (CELEBREX) 100 MG capsule Take 100 mg by mouth 2 (two) times daily.    Yes Historical Provider, MD  docusate sodium (COLACE) 100 MG capsule Take 100 mg by mouth daily as needed for constipation.   Yes Historical Provider, MD  DULoxetine (CYMBALTA) 30 MG capsule Take 30 mg by mouth daily. 08/12/13 08/12/14 Yes Historical Provider, MD  fentaNYL (DURAGESIC - DOSED MCG/HR) 100 MCG/HR Place 300 mcg onto the skin every 3 (three) days.   Yes Historical Provider, MD  gabapentin (NEURONTIN) 600 MG tablet Take 600 mg by mouth 3 (three) times daily.   Yes Historical Provider, MD  Ketamine HCl POWD Place 2 sprays into the nose every hour as needed (Pain).  05/15/14 05/15/15 Yes Historical Provider, MD  megestrol (MEGACE) 40 MG/ML suspension Take 100 mg by mouth every other day.    Yes Historical Provider, MD  methadone (DOLOPHINE) 10 MG tablet  Take 10 mg by mouth every 8 (eight) hours.   Yes Historical Provider, MD  NON FORMULARY    Yes Historical Provider, MD  omeprazole (PRILOSEC) 20 MG capsule Take 20 mg by mouth daily.   Yes Historical Provider, MD  ondansetron (ZOFRAN) 8 MG tablet Take by mouth every 8 (eight) hours as needed for nausea or vomiting.   Yes Historical Provider, MD  oxycodone (ROXICODONE) 30 MG immediate release tablet Take 45 mg by mouth every 4 (four) hours as needed for pain.    Yes Historical Provider, MD  rivaroxaban (XARELTO) 10 MG TABS tablet Take 10 mg by mouth 2 (two) times daily.   Yes Historical Provider,  MD  sodium chloride 0.9 % SOLN 50 mL with morphine 15 MG/ML SOLN 2 mg/mL Inject into the vein continuous.   Yes Historical Provider, MD   BP 110/60 mmHg  Pulse 141  Temp(Src) 98.1 F (36.7 C) (Oral)  Resp 17  Ht _0  (1.422 m)  Wt 117 lb (53.071 kg)  BMI 26.25 kg/m2  SpO2 96% Physical Exam  Constitutional: She is oriented to person, place, and time. She appears well-developed and well-nourished. No distress.  Pt appears chronically ill and somewhat emaciated.   HENT:  Head: Normocephalic and atraumatic.  Eyes: Conjunctivae and EOM are normal.  Neck: Neck supple. No tracheal deviation present.  Cardiovascular: Normal rate, regular rhythm and normal heart sounds.   No murmur heard. Pulmonary/Chest: Effort normal. No respiratory distress. She has no wheezes.  Abdominal: Soft. Bowel sounds are normal. She exhibits no distension. There is no rebound and no guarding.  Musculoskeletal: Normal range of motion.  TTP over both lateral hips with no obvious deformities. She shows muscle wasting from paraplegia.   Neurological: She is alert and oriented to person, place, and time.  Skin: Skin is warm and dry.  Psychiatric: She has a normal mood and affect. Her behavior is normal.  Nursing note and vitals reviewed.   ED Course  Procedures (including critical care time) DIAGNOSTIC STUDIES: Oxygen Saturation is 96% on RA, adequate by my interpretation.    COORDINATION OF CARE: 12:47 AM- Pt advised of plan for treatment and pt agrees.    Labs Review Labs Reviewed - No data to display  Imaging Review No results found.   EKG Interpretation None      MDM   Final diagnoses:  None    Patient with history of metastatic sarcoma. She is brought for evaluation of severe hip and thigh pain that started acutely in the absence of any injury or trauma. She receives the majority of her care at [facility]. The family spoke with a Dr. Suzie Portela from the oncology service who informed the  on-duty ER doctor of the patient's imminent arrival.  She was evaluated promptly by myself. I was able to Murphy with Dr. Suzie Portela who recommended I Murphy with the transfer line to discuss admission. I spoke with the pediatric oncology service physician, Dr.Zwemer who agrees to accept the patient in transfer. The family are requesting that the workup be performed at [facility]  I personally performed the services described in this documentation, which was scribed in my presence. The recorded information has been reviewed and is accurate.    Stacey Speak, MD 06/17/14 [phone]

## 2014-06-30 ENCOUNTER — Inpatient Hospital Stay (HOSPITAL_COMMUNITY)
Admission: EM | Admit: 2014-06-30 | Discharge: 2014-07-04 | DRG: 948 | Disposition: A | Discharge status: Home / Self Care | Payer: Medicaid Other | Attending: Family Medicine | Admitting: Family Medicine

## 2014-06-30 ENCOUNTER — Encounter (HOSPITAL_COMMUNITY): Payer: Self-pay | Admitting: *Deleted

## 2014-06-30 DIAGNOSIS — C495 Malignant neoplasm of connective and soft tissue of pelvis: Secondary | ICD-10-CM | POA: Diagnosis present

## 2014-06-30 DIAGNOSIS — R52 Pain, unspecified: Secondary | ICD-10-CM

## 2014-06-30 DIAGNOSIS — Z9981 Dependence on supplemental oxygen: Secondary | ICD-10-CM

## 2014-06-30 DIAGNOSIS — R079 Chest pain, unspecified: Secondary | ICD-10-CM | POA: Diagnosis present

## 2014-06-30 DIAGNOSIS — C7800 Secondary malignant neoplasm of unspecified lung: Secondary | ICD-10-CM | POA: Diagnosis present

## 2014-06-30 DIAGNOSIS — Z79899 Other long term (current) drug therapy: Secondary | ICD-10-CM | POA: Diagnosis not present

## 2014-06-30 DIAGNOSIS — G8191 Hemiplegia, unspecified affecting right dominant side: Secondary | ICD-10-CM

## 2014-06-30 DIAGNOSIS — Z886 Allergy status to analgesic agent status: Secondary | ICD-10-CM

## 2014-06-30 DIAGNOSIS — Z515 Encounter for palliative care: Secondary | ICD-10-CM

## 2014-06-30 DIAGNOSIS — C7989 Secondary malignant neoplasm of other specified sites: Secondary | ICD-10-CM | POA: Diagnosis present

## 2014-06-30 DIAGNOSIS — Z66 Do not resuscitate: Secondary | ICD-10-CM | POA: Diagnosis present

## 2014-06-30 DIAGNOSIS — G8929 Other chronic pain: Secondary | ICD-10-CM | POA: Diagnosis present

## 2014-06-30 DIAGNOSIS — M79659 Pain in unspecified thigh: Secondary | ICD-10-CM | POA: Diagnosis present

## 2014-06-30 DIAGNOSIS — M25552 Pain in left hip: Secondary | ICD-10-CM | POA: Diagnosis present

## 2014-06-30 DIAGNOSIS — R000 Tachycardia, unspecified: Secondary | ICD-10-CM | POA: Diagnosis present

## 2014-06-30 DIAGNOSIS — G893 Neoplasm related pain (acute) (chronic): Secondary | ICD-10-CM | POA: Diagnosis not present

## 2014-06-30 DIAGNOSIS — C499 Malignant neoplasm of connective and soft tissue, unspecified: Secondary | ICD-10-CM | POA: Diagnosis present

## 2014-06-30 DIAGNOSIS — M25551 Pain in right hip: Secondary | ICD-10-CM | POA: Diagnosis present

## 2014-06-30 DIAGNOSIS — Z888 Allergy status to other drugs, medicaments and biological substances status: Secondary | ICD-10-CM

## 2014-06-30 HISTORY — DX: Tachycardia, unspecified: R00.0

## 2014-06-30 MED ORDER — CABOZANTINIB S-MALATE 3 X 20 MG PO KIT
20.0000 mg | PACK | ORAL | Status: DC
  Administered 2014-06-30: 20 mg via ORAL

## 2014-06-30 MED ORDER — HYDROMORPHONE HCL 2 MG/ML IJ SOLN
INTRAMUSCULAR | Status: AC
  Filled 2014-06-30: qty 1

## 2014-06-30 MED ORDER — DULOXETINE HCL 30 MG PO CPEP
30.0000 mg | ORAL_CAPSULE | Freq: Every day | ORAL | Status: DC
  Administered 2014-07-01 – 2014-07-04 (×4): 30 mg via ORAL
  Filled 2014-06-30 (×4): qty 1

## 2014-06-30 MED ORDER — GABAPENTIN 300 MG PO CAPS
600.0000 mg | ORAL_CAPSULE | Freq: Three times a day (TID) | ORAL | Status: DC
  Filled 2014-06-30: qty 2

## 2014-06-30 MED ORDER — ONDANSETRON HCL 4 MG/2ML IJ SOLN: 4.0000 mg | Freq: Four times a day (QID) | INTRAMUSCULAR | Status: DC | PRN

## 2014-06-30 MED ORDER — CELECOXIB 100 MG PO CAPS
100.0000 mg | ORAL_CAPSULE | Freq: Two times a day (BID) | ORAL | Status: DC
  Administered 2014-06-30 – 2014-07-04 (×8): 100 mg via ORAL
  Filled 2014-06-30 (×8): qty 1

## 2014-06-30 MED ORDER — SODIUM CHLORIDE 0.9 % IV SOLN
3.0000 mg/h | INTRAVENOUS | Status: DC
  Administered 2014-06-30 – 2014-07-01 (×2): 1 mg/h via INTRAVENOUS
  Filled 2014-06-30 (×2): qty 2.5

## 2014-06-30 MED ORDER — SODIUM CHLORIDE 0.9 % IV SOLN
INTRAVENOUS | Status: DC
  Administered 2014-06-30: 50 mL/h via INTRAVENOUS
  Administered 2014-07-01 – 2014-07-02 (×3): via INTRAVENOUS

## 2014-06-30 MED ORDER — LORAZEPAM 2 MG/ML IJ SOLN
INTRAMUSCULAR | Status: AC
  Filled 2014-06-30: qty 1

## 2014-06-30 MED ORDER — RIVAROXABAN 10 MG PO TABS
10.0000 mg | ORAL_TABLET | Freq: Two times a day (BID) | ORAL | Status: DC
  Administered 2014-06-30 – 2014-07-04 (×8): 10 mg via ORAL
  Filled 2014-06-30 (×14): qty 1

## 2014-06-30 MED ORDER — LORAZEPAM 2 MG/ML IJ SOLN
2.0000 mg | Freq: Once | INTRAMUSCULAR | Status: AC
  Administered 2014-06-30: 2 mg via INTRAVENOUS

## 2014-06-30 MED ORDER — ONDANSETRON HCL 4 MG PO TABS: 4.0000 mg | ORAL_TABLET | Freq: Four times a day (QID) | ORAL | Status: DC | PRN

## 2014-06-30 MED ORDER — MEGESTROL ACETATE 400 MG/10ML PO SUSP
100.0000 mg | ORAL | Status: DC
  Administered 2014-07-01 – 2014-07-03 (×2): 100 mg via ORAL
  Filled 2014-06-30 (×2): qty 5
  Filled 2014-06-30: qty 10

## 2014-06-30 MED ORDER — CARBOXYMETHYLCELLULOSE SODIUM 0.5 % OP SOLN: 1.0000 [drp] | Freq: Four times a day (QID) | OPHTHALMIC | Status: DC | PRN

## 2014-06-30 MED ORDER — HYDROMORPHONE HCL 2 MG/ML IJ SOLN
2.0000 mg | Freq: Once | INTRAMUSCULAR | Status: AC
  Administered 2014-06-30: 2 mg via INTRAVENOUS

## 2014-06-30 MED ORDER — POLYVINYL ALCOHOL 1.4 % OP SOLN: 1.0000 [drp] | Freq: Four times a day (QID) | OPHTHALMIC | Status: DC | PRN

## 2014-06-30 MED ORDER — GABAPENTIN 300 MG PO CAPS
600.0000 mg | ORAL_CAPSULE | ORAL | Status: DC
  Administered 2014-07-02: 600 mg via ORAL
  Filled 2014-06-30: qty 6

## 2014-06-30 MED ORDER — ALBUTEROL SULFATE (2.5 MG/3ML) 0.083% IN NEBU: 2.5000 mg | INHALATION_SOLUTION | Freq: Four times a day (QID) | RESPIRATORY_TRACT | Status: DC | PRN

## 2014-06-30 MED ORDER — HYDROMORPHONE HCL 2 MG/ML IJ SOLN
2.0000 mg | Freq: Once | INTRAMUSCULAR | Status: AC
  Administered 2014-06-30: 2 mg via INTRAVENOUS
  Filled 2014-06-30: qty 1

## 2014-06-30 MED ORDER — CABOZANTINIB S-MALATE 3 X 20 MG PO KIT: 20.0000 mg | PACK | ORAL | Status: DC

## 2014-06-30 MED ORDER — PANTOPRAZOLE SODIUM 40 MG PO TBEC
40.0000 mg | DELAYED_RELEASE_TABLET | Freq: Every day | ORAL | Status: DC
  Administered 2014-07-01 – 2014-07-04 (×4): 40 mg via ORAL
  Filled 2014-06-30 (×4): qty 1

## 2014-06-30 MED ORDER — HYDROMORPHONE HCL 2 MG/ML IJ SOLN
2.0000 mg | Freq: Once | INTRAMUSCULAR | Status: AC
Start: 2014-06-30 — End: 2014-06-30
  Administered 2014-06-30: 2 mg via INTRAVENOUS

## 2014-06-30 MED ORDER — SODIUM CHLORIDE 0.9 % IV SOLN
1000.0000 mL | Freq: Once | INTRAVENOUS | Status: AC
  Administered 2014-06-30: 1000 mL via INTRAVENOUS

## 2014-06-30 MED ORDER — LIDOCAINE 5 % EX PTCH
1.0000 | MEDICATED_PATCH | CUTANEOUS | Status: DC
  Administered 2014-06-30 – 2014-07-03 (×4): 1 via TRANSDERMAL
  Filled 2014-06-30 (×7): qty 1

## 2014-06-30 MED ORDER — HYDROMORPHONE HCL 1 MG/ML IJ SOLN
1.0000 mg | Freq: Once | INTRAMUSCULAR | Status: AC
  Administered 2014-06-30: 1 mg via INTRAVENOUS
  Filled 2014-06-30: qty 1

## 2014-06-30 MED ORDER — DOCUSATE SODIUM 100 MG PO CAPS: 100.0000 mg | ORAL_CAPSULE | Freq: Every day | ORAL | Status: DC | PRN

## 2014-06-30 MED ORDER — ALBUTEROL SULFATE HFA 108 (90 BASE) MCG/ACT IN AERS
2.0000 | INHALATION_SPRAY | Freq: Four times a day (QID) | RESPIRATORY_TRACT | Status: DC | PRN
  Filled 2014-06-30: qty 6.7

## 2014-06-30 NOTE — H&P (Signed)
[facility] History and Physical  Stacey Murphy QJF:[id number] DOB: 1990-11-11 DOA: 06/30/2014  Referring physician: ER PCP: Duard Larsen, MD   Chief Complaint: Thigh pain  HPI: Stacey Murphy is a 23 y.o. female  This is an unfortunate 23 year old lady with a past medical history of metastatic sarcoma who presents to the ER complaining of bilateral thigh pain/hip pain associated with some chest pain. This started at 10 AM this morning. She has been treated for metastatic sarcoma at [facility] and is on high-dose analgesics. She is no longer receiving radiation but apparently is still taking some sort of chemotherapy pill. She is on a morphine pump at 18 mg an hour and has numerous and no patches. The patient desires comfort measures only and she does not want to be resuscitated in the event of a cardiorespiratory arrest. She has not been eating and drinking well over the past several days. She apparently has had no urine output since last night. She is now being admitted for pain control.     Past Medical History  Diagnosis Date  . Metastatic sarcoma   . Alveolar soft part sarcoma   . Chronic pain due to neoplasm   . Chronic back pain   . Chronic left hip pain   . Right leg DVT   . Pathological fracture of right femur   . Pathological fracture of right humerus   . Right hemiparesis    Past Surgical History  Procedure Laterality Date  . Portacath placement    . Pain pump implantation    . Hip surgery Bilateral    Social History:  reports that she has never smoked. She does not have any smokeless tobacco history on file. She reports that she does not drink alcohol or use illicit drugs.  Allergies  Allergen Reactions  . Oxycodone Hives and Itching    Unlikely allergy. Pt states this reaction ONLY with oxyCONTIN, not oxycodone. See 03/20/13 note.  . Poractant Alfa Swelling    Patient states her face swells up when she eats sausage only, no other pork   .  Pork-Derived Products Swelling    Patient states her face swells up when she eats sausage only, no other pork     History reviewed. No pertinent family history.   Prior to Admission medications   Medication Sig Start Date End Date Taking? Authorizing Provider  celecoxib (CELEBREX) 100 MG capsule Take 100 mg by mouth 2 (two) times daily.    Yes Historical Provider, MD  DULoxetine (CYMBALTA) 30 MG capsule Take 30 mg by mouth daily. 08/12/13 08/12/14 Yes Historical Provider, MD  fentaNYL (DURAGESIC - DOSED MCG/HR) 100 MCG/HR Place 300 mcg onto the skin every 3 (three) days.   Yes Historical Provider, MD  lidocaine (LIDODERM) 5 % Place 1 patch onto the skin daily. Remove & Discard patch within 12 hours or as directed by MD   Yes Historical Provider, MD  megestrol (MEGACE) 40 MG/ML suspension Take 100 mg by mouth every other day.    Yes Historical Provider, MD  omeprazole (PRILOSEC) 20 MG capsule Take 20 mg by mouth daily.   Yes Historical Provider, MD  ondansetron (ZOFRAN) 8 MG tablet Take 8 mg by mouth every 8 (eight) hours as needed for nausea or vomiting.    Yes Historical Provider, MD  oxycodone (ROXICODONE) 30 MG immediate release tablet Take 90 mg by mouth every 4 (four) hours as needed for pain.    Yes Historical Provider, MD  rivaroxaban Alveda Reasons)  10 MG TABS tablet Take 10 mg by mouth 2 (two) times daily.   Yes Historical Provider, MD  albuterol (VENTOLIN HFA) 108 (90 BASE) MCG/ACT inhaler Inhale 2 puffs into the lungs every 6 (six) hours as needed. For shortness of breath/cough    Historical Provider, MD  cabozantinib s-malate (COMETRIQ) capsule (3 x $Re'20mg'ErM$  kit) Take 20 mg by mouth 3 (three) times a week. 05/16/14   Historical Provider, MD  carboxymethylcellulose (REFRESH PLUS) 0.5 % SOLN 1 drop 4 (four) times daily as needed.    Historical Provider, MD  docusate sodium (COLACE) 100 MG capsule Take 100 mg by mouth daily as needed for constipation.    Historical Provider, MD  gabapentin  (NEURONTIN) 600 MG tablet Take 600 mg by mouth 3 (three) times daily.    Historical Provider, MD  Ketamine HCl POWD Place 2 sprays into the nose every hour as needed (Pain).  05/15/14 05/15/15  Historical Provider, MD  methadone (DOLOPHINE) 10 MG tablet Take 10 mg by mouth every 8 (eight) hours.    Historical Provider, MD  sodium chloride 0.9 % SOLN 50 mL with morphine 15 MG/ML SOLN 2 mg/mL Inject into the vein continuous.    Historical Provider, MD   Physical Exam: Filed Vitals:   06/30/14 1330 06/30/14 1525 06/30/14 1529 06/30/14 1535  BP: 164/62   128/74  Pulse: 66 87 177 176  Temp:    97.7 F (36.5 C)  TempSrc:    Oral  Resp: 44 36 32 27  SpO2: 77% 94% 100% 97%    Wt Readings from Last 3 Encounters:  06/17/14 53.071 kg (117 lb)  05/18/14 53.071 kg (117 lb)  04/26/14 53.071 kg (117 lb)    General:  Appears in pain. She looks clinically dehydrated. Eyes: PERRL, normal lids, irises & conjunctiva ENT: grossly normal hearing, lips & tongue Neck: no LAD, masses or thyromegaly Cardiovascular: RRR, no m/r/g. No LE edema. Telemetry: SR, no arrhythmias  Respiratory: CTA bilaterally, no w/r/r. Normal respiratory effort. Abdomen: soft, ntnd Skin: no rash or induration seen on limited exam Musculoskeletal: Did not examine her thighs as she was clearly in severe pain. Psychiatric: grossly normal mood and affect, speech fluent and appropriate Neurologic: grossly non-focal.          Labs on Admission:  Basic Metabolic Panel: No results for input(s): NA, K, CL, CO2, GLUCOSE, BUN, CREATININE, CALCIUM, MG, PHOS in the last 168 hours. Liver Function Tests: No results for input(s): AST, ALT, ALKPHOS, BILITOT, PROT, ALBUMIN in the last 168 hours. No results for input(s): LIPASE, AMYLASE in the last 168 hours. No results for input(s): AMMONIA in the last 168 hours. CBC: No results for input(s): WBC, NEUTROABS, HGB, HCT, MCV, PLT in the last 168 hours. Cardiac Enzymes: No results for  input(s): CKTOTAL, CKMB, CKMBINDEX, TROPONINI in the last 168 hours.  BNP (last 3 results) No results for input(s): PROBNP in the last 8760 hours. CBG: No results for input(s): GLUCAP in the last 168 hours.  Radiological Exams on Admission: No results found.    Assessment/Plan Active Problems:   Metastatic sarcoma   Comfort measures only status   1. Metastatic sarcoma with bony pain. 2. Comfort measures.  Plan: 1. Admit to medical floor. 2. Start hydromorphone drip. Titrate as necessary. 3. IV fluids gentle.  Further recommendations will depend on patient's hospital progress.   Code Status: DO NOT RESUSCITATE   Family Communication: I discussed the plan with the patient's mother and father as well as  brother.   Disposition Plan: Depending on progress.  Time spent: 60 minutes.  Doree Albee [facility] Pager [phone].

## 2014-06-30 NOTE — Progress Notes (Signed)
Text paged Dr. Ezzie Dural of the patient's c/o chest pain rated at a 5 and described as sharp.  The patient appears to be comfortable but may need more pain medication.  Night nurse was made aware of the page and the patients complaint.

## 2014-06-30 NOTE — ED Notes (Signed)
Pt had no pain relief for, 1st med, EDP to repeat dilaudid order.

## 2014-06-30 NOTE — ED Provider Notes (Addendum)
TIME SEEN: 1:48 PM  CHIEF COMPLAINT: chest pain, bilateral hip pain   HPI:   HPI Comments: Stacey Murphy is a 23 y.o. female with past medical history of metastatic sarcoma who presents to the Emergency Department complaining of sharp chest pain and bilateral hip pain, beginning this morning at 10:00. She denies prior experience with similar symptoms. She denies fever, nausea, vomiting, SOB. She would like pain control at this time; she does not care for further studies. She reports she is a DO NOT RESUSCITATE/DO NOT INTUBATE.  Per EMS, pt is being seen at [facility] for metastatic sarcoma; her bilateral hip pain is constant but today is much more intense and uncontrolled with her pain medicine. She is no longer receiving active chemotherapy or radiation; comfort care at home at this time. Pt is on morphine pump (74m an hour) going to her chest port and has numerous fentanyl patches in place.  Patient's mother reports that the patient has not been eating and drinking well over the past several days. She reports that she has had no urine output since last night. She understands that the patient is at the end of life.    ROS: See HPI Constitutional: no fever  Eyes: no drainage  ENT: no runny nose   Cardiovascular:   chest pain  Resp: no SOB  GI: no vomiting GU: no dysuria Integumentary: no rash  Allergy: no hives  Musculoskeletal: no leg swelling  Neurological: no slurred speech ROS otherwise negative  PAST MEDICAL HISTORY/PAST SURGICAL HISTORY:  Past Medical History  Diagnosis Date  . Metastatic sarcoma   . Alveolar soft part sarcoma   . Chronic pain due to neoplasm   . Chronic back pain   . Chronic left hip pain   . Right leg DVT   . Pathological fracture of right femur   . Pathological fracture of right humerus   . Right hemiparesis     MEDICATIONS:  Prior to Admission medications   Medication Sig Start Date End Date Taking? Authorizing Provider  albuterol (VENTOLIN  HFA) 108 (90 BASE) MCG/ACT inhaler Inhale 2 puffs into the lungs every 6 (six) hours as needed. For shortness of breath/cough    Historical Provider, MD  cabozantinib s-malate (COMETRIQ) capsule (3 x 231mkit) Take 20 mg by mouth 3 (three) times a week. 05/16/14   Historical Provider, MD  carboxymethylcellulose (REFRESH PLUS) 0.5 % SOLN 1 drop 4 (four) times daily as needed.    Historical Provider, MD  celecoxib (CELEBREX) 100 MG capsule Take 100 mg by mouth 2 (two) times daily.     Historical Provider, MD  docusate sodium (COLACE) 100 MG capsule Take 100 mg by mouth daily as needed for constipation.    Historical Provider, MD  DULoxetine (CYMBALTA) 30 MG capsule Take 30 mg by mouth daily. 08/12/13 08/12/14  Historical Provider, MD  fentaNYL (DURAGESIC - DOSED MCG/HR) 100 MCG/HR Place 300 mcg onto the skin every 3 (three) days.    Historical Provider, MD  gabapentin (NEURONTIN) 600 MG tablet Take 600 mg by mouth 3 (three) times daily.    Historical Provider, MD  Ketamine HCl POWD Place 2 sprays into the nose every hour as needed (Pain).  05/15/14 05/15/15  Historical Provider, MD  megestrol (MEGACE) 40 MG/ML suspension Take 100 mg by mouth every other day.     Historical Provider, MD  methadone (DOLOPHINE) 10 MG tablet Take 10 mg by mouth every 8 (eight) hours.    Historical Provider, MD  NON FORMULARY     Historical Provider, MD  omeprazole (PRILOSEC) 20 MG capsule Take 20 mg by mouth daily.    Historical Provider, MD  ondansetron (ZOFRAN) 8 MG tablet Take by mouth every 8 (eight) hours as needed for nausea or vomiting.    Historical Provider, MD  oxycodone (ROXICODONE) 30 MG immediate release tablet Take 45 mg by mouth every 4 (four) hours as needed for pain.     Historical Provider, MD  rivaroxaban (XARELTO) 10 MG TABS tablet Take 10 mg by mouth 2 (two) times daily.    Historical Provider, MD  sodium chloride 0.9 % SOLN 50 mL with morphine 15 MG/ML SOLN 2 mg/mL Inject into the vein continuous.     Historical Provider, MD    ALLERGIES:  Allergies  Allergen Reactions  . Oxycodone Hives and Itching    Unlikely allergy. Pt states this reaction ONLY with oxyCONTIN, not oxycodone. See 03/20/13 note.  . Poractant Alfa Swelling    Patient states her face swells up when she eats sausage only, no other pork   . Pork-Derived Products Swelling    Patient states her face swells up when she eats sausage only, no other pork     SOCIAL HISTORY:  History  Substance Use Topics  . Smoking status: Never Smoker   . Smokeless tobacco: Not on file  . Alcohol Use: No    FAMILY HISTORY: History reviewed. No pertinent family history.  EXAM: BP 128/74 mmHg  Pulse 176  Temp(Src) 97.7 F (36.5 C) (Oral)  Resp 27  SpO2 97% CONSTITUTIONAL: Alert and oriented and responds appropriately to questions. Appears very uncomfortable and crying out in pain, chronically ill  HEAD: Normocephalic EYES: Conjunctivae clear, PERRL ENT: normal nose; no rhinorrhea; extremely dry mucous membranes; pharynx without lesions noted NECK: Supple, no meningismus, no LAD  CARD: Regular and tachycardic; S1 and S2 appreciated; no murmurs, no clicks, no rubs, no gallops RESP: Normal chest excursion without splinting or tachypnea; breath sounds clear and equal bilaterally; no wheezes, no rhonchi, no rales, patient initially does have hypoxia and is placed on 2 L and her oxygen saturation is now normal ABD/GI: Chronic indwelling foley catheter; abdomen is soft and nontender to palpation BACK:  The back appears normal and is non-tender to palpation, there is no CVA tenderness EXT: Flaccid BLE with muscle atrophy; extremely tender to palpation over her bilateral hips without joint effusion or erythema or warmth SKIN: Normal color for age and race; warm NEURO: Moves all extremities equally    MEDICAL DECISION MAKING: Patient here with hip pain, chest pain. She is externally tachycardic and hypoxic. On the monitor, patient has a  wide-complex tachycardia with a heart rate in the 180s. Concerning for ventricular tachycardia. Patient states she is a DO NOT RESUSCITATE, DO NOT INTUBATE. She has here for pain control only. She does not want further workup. Mother confirmed this as well. We'll give IV fluids, Dilaudid, Ativan. She currently does have a morphine pump.  ED PROGRESS: 2:30 PM  Patient continues to be in significant pain. Repeating doses of Dilaudid and Ativan. Family at bedside.  3:02 PM Upon recheck, patient reports slight improvement in pain. She seems much more calm, respiratory rate and heart rate have improved. Patient explains that she regularly receives ketamine drips for pain control at [facility]. Discussed with patient that we're unable to provide this at [facility] due to nursing protocols but also this would be unsafe medication given her tachycardia, tachypnea. She understood need  to be admitted for pain control and agreed to plan at this time. Will d/w hospitalist.    EKG Interpretation  Date/Time:  Monday June 30 2014 15:26:49 EST Ventricular Rate:  174 PR Interval:  110 QRS Duration: 132 QT Interval:  274 QTC Calculation: 466 R Axis:   22 Text Interpretation:  Incomplete analysis due to missing data in precordial lead(s) Sinus tachycardia Paired ventricular premature complexes Nonspecific intraventricular conduction delay Minimal ST depression Artifact in lead(s) I II aVR aVL V1 V2 V3 V6 and baseline wander in lead(s) I III aVR aVL aVF Missing lead(s): V4 Confirmed by Chinedum Vanhouten,  DO, Miciah Covelli ([id number]) on 06/30/2014 3:31:22 PM        [place], DO 06/30/14 [place], DO 06/30/14 1630

## 2014-06-30 NOTE — ED Notes (Signed)
Pt still screaming out in pain, very diaphoretic, EDP aware, will order more pain medicine.

## 2014-06-30 NOTE — ED Notes (Signed)
Called to give report, nurse unavailable.  

## 2014-06-30 NOTE — Progress Notes (Signed)
Notified Oncoming nurse Caleen Jobs, RN that the patient will require her dose of chemo med that the family has brought from home and the rest should be sent to the pharmacy.

## 2014-06-30 NOTE — ED Notes (Addendum)
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  HR at 186, pt co chest pain as well. Pt sent by [facility] for pain control. Pt is on morphine pump going to her chest port, and has numerous fentanyl patches in place, 3 at 100mg  each.

## 2014-07-01 ENCOUNTER — Encounter (HOSPITAL_COMMUNITY): Payer: Self-pay | Admitting: Internal Medicine

## 2014-07-01 DIAGNOSIS — R000 Tachycardia, unspecified: Secondary | ICD-10-CM | POA: Diagnosis present

## 2014-07-01 DIAGNOSIS — G8191 Hemiplegia, unspecified affecting right dominant side: Secondary | ICD-10-CM

## 2014-07-01 DIAGNOSIS — R52 Pain, unspecified: Secondary | ICD-10-CM | POA: Diagnosis present

## 2014-07-01 DIAGNOSIS — G8190 Hemiplegia, unspecified affecting unspecified side: Secondary | ICD-10-CM

## 2014-07-01 MED ORDER — DIPHENHYDRAMINE HCL 12.5 MG/5ML PO ELIX
6.2500 mg | ORAL_SOLUTION | Freq: Four times a day (QID) | ORAL | Status: DC | PRN
  Administered 2014-07-01 (×2): 6.25 mg via ORAL
  Filled 2014-07-01 (×2): qty 5

## 2014-07-01 MED ORDER — SODIUM CHLORIDE 0.9 % IV SOLN
0.2000 mg/h | INTRAVENOUS | Status: DC
  Administered 2014-07-01: 4 mg/h via INTRAVENOUS
  Administered 2014-07-02: 4.6 mg/h via INTRAVENOUS
  Administered 2014-07-02: 4.8 mg/h via INTRAVENOUS
  Administered 2014-07-02: 4.4 mg/h via INTRAVENOUS
  Administered 2014-07-03: 3 mg/h via INTRAVENOUS
  Administered 2014-07-03: 5 mg/h via INTRAVENOUS
  Administered 2014-07-04: 3.6 mg/h via INTRAVENOUS
  Administered 2014-07-04: 3.4 mg/h via INTRAVENOUS
  Filled 2014-07-01 (×2): qty 10

## 2014-07-01 NOTE — Progress Notes (Signed)
UR chart review completed.  

## 2014-07-01 NOTE — Progress Notes (Signed)
Patient's [facility] nurse called to check on patient and stated that patient has an implanted PCA Dilaudid pump that patient controls with a remote.  Clarified this information with patient and patient's mother stated that the remote is currently at home and patient is not using implanted Dilaudid pump.  Will continue to monitor patient.

## 2014-07-01 NOTE — Care Management Note (Addendum)
    Page 1 of 2   07/04/2014     3:06:59 PM CARE MANAGEMENT NOTE 07/04/2014  Patient:  Stacey Murphy, Stacey Murphy   Account Number:  [id number]  Date Initiated:  07/01/2014  Documentation initiated by:  Theophilus Kinds  Subjective/Objective Assessment:   Pt admitted from home with uncontrolled pain due to metastatic CA. Pt lives with her mother and father and will return home at discharge. Pt is active with [facility] RN for management of pain pump. Pt has CAP aide daily. Pt has hospital bed,     Action/Plan:   home O2, lift, and other DME needed. Pt wants to return home with [facility] once pain is controlled with dilaudid and [facility] will manage dilaudid gtt at discharge. No hospice services at this time.   Anticipated DC Date:  07/04/2014   Anticipated DC Plan:  [place]  CM consult      [facility] Choice  HOSPICE   Choice offered to / List presented to:  C-1 Patient           [facility] agency  [facility]   Status of service:  Completed, signed off Medicare Important Message given?   (If response is "NO", the following Medicare IM given date fields will be blank) Date Medicare IM given:   Medicare IM given by:   Date Additional Medicare IM given:   Additional Medicare IM given by:    Discharge Disposition:  [place]  Per UR Regulation:    If discussed at Long Length of Stay Meetings, dates discussed:    Comments:  07/04/14 [place], RN BSN [facility] unable to provide pts dilaudid gtt due to insurance restrictions. [facility] to provide gtt and will be delivered between 3:30-4:00 today. Once medication received at home family will call bedside RN and then EMS and Hospice will be called to meet pt at home to hook up dilaudid gtt. Pt and pts RN aware of above.  07/04/14 [place], RN BSN CM Pt discharging home today with [facility]. Order for dilaudid gtt faxed to [place] per Hospice  request. Once gtt has been delivered to pts home, family is to notify pts RN and then Hospice and EMS will be called for pts discharge home. Hospice will meet pt at home and hook dilaudid gtt up. Pt has hospital bed and home O2 in place at home. Pt and pts nurse aware of discharge arrangements.   07/03/14 [place], RN BSN CM Pt has decided to stop chemo pill. Family wanted someone from [facility] to come and speak with them about their services. Beth from [facility] spoke with family and has agreed to d/c with Hospice. MD still adjusting pain medications and anticipates discharge within 24 hours.  07/01/14 [place], RN BSN CM

## 2014-07-01 NOTE — Progress Notes (Signed)
[facility] PROGRESS NOTE  Stacey Murphy JYN:[id number] DOB: 1990/12/14 DOA: 06/30/2014 PCP: Duard Larsen, MD  Assessment/Plan: Principal Problem:   Intractable pain: related to metastatic sarcoma. Mostly chest this am. Reports bilateral hip/thigh pain improved control. Home pain medications include fentanyl patch, gabapentin, lidocaine methadone and morphine pump. Given dilaudid drip on admission. Dilaudid titrated up to 3mg /hr. Have discussed with pharmacy who will assist with end of life titration guidelines. Comfort measures as below Active Problems:   Comfort measures only status: per patient. Of note, past/recent presentations to [facility] ED resulted in transfer to [facility].     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 has been treated at [facility] with chemo and radiation. Radiation stopped. Reportedly on "chemo pil" at home.  Comfort care as above  Sinus tachycardia: chart review indicates chronic. Will monitor.    Code Status: DNR Family Communication: mother at bedside Disposition Plan: ? Hospice Home   Consultants:  none  Procedures:  none  Antibiotics:  none  HPI/Subjective: Awake but drowsy. Reports pain improved but "chest still hurst".   Objective: Filed Vitals:   07/01/14 0555  BP: 107/50  Pulse: 132  Temp: 98.6 F (37 C)  Resp: 26   No intake or output data in the 24 hours ending 07/01/14 0925 There were no vitals filed for this visit.  Exam:   General:  Thin deconditioned chronically ill appearing  Cardiovascular: tachycardic but regular. No MGR No LE edema  Respiratory: normal effort somewhat shallow. BS clear bilaterally to ausculation no wheeze  Abdomen: flat soft +BS  Musculoskeletal: no clubbing or cyanosis. Increased pain with any movement   Data Reviewed: Basic Metabolic Panel: No results for input(s): NA, K, CL, CO2, GLUCOSE, BUN, CREATININE, CALCIUM, MG, PHOS in the last 168 hours. Liver Function Tests: No results for  input(s): AST, ALT, ALKPHOS, BILITOT, PROT, ALBUMIN in the last 168 hours. No results for input(s): LIPASE, AMYLASE in the last 168 hours. No results for input(s): AMMONIA in the last 168 hours. CBC: No results for input(s): WBC, NEUTROABS, HGB, HCT, MCV, PLT in the last 168 hours. Cardiac Enzymes: No results for input(s): CKTOTAL, CKMB, CKMBINDEX, TROPONINI in the last 168 hours. BNP (last 3 results) No results for input(s): PROBNP in the last 8760 hours. CBG: No results for input(s): GLUCAP in the last 168 hours.  No results found for this or any previous visit (from the past 240 hour(s)).   Studies: No results found.  Scheduled Meds: . cabozantinib s-malate  20 mg Oral Once per day on Mon Wed Fri  . celecoxib  100 mg Oral BID  . DULoxetine  30 mg Oral Daily  . gabapentin  600 mg Oral Once per day on Mon Wed Fri  . lidocaine  1 patch Transdermal Q24H  . megestrol  100 mg Oral QODAY  . pantoprazole  40 mg Oral Daily  . rivaroxaban  10 mg Oral BID   Continuous Infusions: . sodium chloride 50 mL/hr at 07/01/14 0239  . HYDROmorphone 3 mg/hr (07/01/14 0640)      Time spent: [facility] Pager [phone]. If 7PM-7AM, please contact night-coverage at [email or web address], password [facility] 07/01/2014, 9:25 AM  LOS: 1 day

## 2014-07-01 NOTE — Progress Notes (Signed)
Increased patients dilaudid rate to 3mg /hour per patients request. Rating pain 8/10

## 2014-07-01 NOTE — Progress Notes (Signed)
Nutrition Brief Note  Chart reviewed. Pt now transitioning to comfort care.  No further nutrition interventions warranted at this time.  Please re-consult as needed.   Rawlin Reaume A. Anthony Roland, RD, LDN Pager: [phone] After hours Pager: [phone]  

## 2014-07-02 MED ORDER — HYDROMORPHONE HCL PF 10 MG/ML IJ SOLN
INTRAMUSCULAR | Status: AC
  Filled 2014-07-02: qty 10

## 2014-07-02 MED ORDER — SODIUM CHLORIDE 0.9 % IV SOLN
INTRAVENOUS | Status: DC
  Administered 2014-07-02 – 2014-07-03 (×2): via INTRAVENOUS

## 2014-07-02 NOTE — Progress Notes (Signed)
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 Mostly hip pain this am. Reports pain level close to baseline. Home pain medications include fentanyl patch, gabapentin, lidocaine methadone and morphine pump. Given dilaudid drip on admission. Continue with  Dilaudid. Currently 4.5mg /hr. Chart review indicates pain management challenge historically. Currently not eligible for Hospice and pain management expertise due to being on cabozantinib. Does desire comfort measures as below. Will contact Dr Tommi Rumps at [facility] to discuss cost/benefit of continuing this medication. Of note, his note dated 06/24/14 indicates he has discussed Hospice with family and Dr. Sonny Dandy.  Active Problems:  Comfort measures only status: per patient. Of note, past/recent presentations to [facility] resulted in transfer to [facility]. She does continue taking cabozantinib. Taking this medication makes her ineligible for Hospice. Concern valuable resourse in pain management not available and goal of staying at home being compromised. Will discuss with Heme/Onc at [facility]. Family does not want to make decision about Cabozantinib without discussing with Dr Tommi Rumps at [facility].    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 has been treated at [facility] with chemo and radiation. Radiation stopped. Reportedly on "chemo pil" at home. Comfort care as above  Sinus tachycardia: chart review indicates chronic. At lower end of baseline range.  Will monitor.    Code Status: DNR Family Communication: mother at bedside Disposition Plan: home when ready   Consultants:  none  Procedures:  none  Antibiotics:  none  HPI/Subjective: Awake somewhat lethargic. Reports pain shifting from hip to hip. Feels close to baseline pain level.   Objective: Filed Vitals:   07/02/14 0536  BP: 94/44  Pulse:  105  Temp: 97.9 F (36.6 C)  Resp: 14    Intake/Output Summary (Last 24 hours) at 07/02/14 0947 Last data filed at 07/02/14 3888  Gross per 24 hour  Intake 1362.17 ml  Output    950 ml  Net 412.17 ml   Filed Weights   07/01/14 1509  Weight: 53.3 kg (117 lb 8.1 oz)    Exam:   General:  Chronically ill appearing  Cardiovascular: tachycardic but regular. Trace LE with r>L  Respiratory: normal effort somewhat shallow, BS distant but clear  Abdomen: soft non-distended +BS non-tender  Musculoskeletal: no clubbing or cyanosis  Data Reviewed: Basic Metabolic Panel: No results for input(s): NA, K, CL, CO2, GLUCOSE, BUN, CREATININE, CALCIUM, MG, PHOS in the last 168 hours. Liver Function Tests: No results for input(s): AST, ALT, ALKPHOS, BILITOT, PROT, ALBUMIN in the last 168 hours. No results for input(s): LIPASE, AMYLASE in the last 168 hours. No results for input(s): AMMONIA in the last 168 hours. CBC: No results for input(s): WBC, NEUTROABS, HGB, HCT, MCV, PLT in the last 168 hours. Cardiac Enzymes: No results for input(s): CKTOTAL, CKMB, CKMBINDEX, TROPONINI in the last 168 hours. BNP (last 3 results) No results for input(s): PROBNP in the last 8760 hours. CBG: No results for input(s): GLUCAP in the last 168 hours.  No results found for this or any previous visit (from the past 240 hour(s)).   Studies: No results found.  Scheduled Meds: . cabozantinib s-malate  20 mg Oral Once per day on Mon Wed Fri  . celecoxib  100 mg Oral BID  . DULoxetine  30 mg Oral Daily  . gabapentin  600 mg Oral Once per day on Mon Wed Fri  . lidocaine  1 patch  Transdermal Q24H  . megestrol  100 mg Oral QODAY  . pantoprazole  40 mg Oral Daily  . rivaroxaban  10 mg Oral BID   Continuous Infusions: . sodium chloride 50 mL/hr at 07/01/14 1959  . HYDROmorphone (DILAUDID) 2 mg/ml infusion - Palliative Care 4.4 mg/hr (07/02/14 0526)    Principal Problem:   Intractable pain Active  Problems:   Comfort measures only status   Metastatic sarcoma   Right hemiparesis   Sinus tachycardia    Time spent: 35 minutes    [facility] Pager [phone]. If 7PM-7AM, please contact night-coverage at [email or web address], password [facility] 07/02/2014, 9:47 AM  LOS: 2 days

## 2014-07-03 MED ORDER — HYDROMORPHONE HCL PF 10 MG/ML IJ SOLN
INTRAMUSCULAR | Status: AC
  Filled 2014-07-03: qty 10

## 2014-07-03 MED ORDER — OXYCODONE HCL 5 MG PO TABS
15.0000 mg | ORAL_TABLET | ORAL | Status: DC
  Administered 2014-07-03 – 2014-07-04 (×8): 15 mg via ORAL
  Filled 2014-07-03 (×8): qty 3

## 2014-07-03 MED ORDER — FENTANYL 100 MCG/HR TD PT72
300.0000 ug | MEDICATED_PATCH | TRANSDERMAL | Status: DC
  Administered 2014-07-03: 300 ug via TRANSDERMAL
  Filled 2014-07-03: qty 3

## 2014-07-03 MED ORDER — GABAPENTIN 300 MG PO CAPS
600.0000 mg | ORAL_CAPSULE | Freq: Three times a day (TID) | ORAL | Status: DC
  Administered 2014-07-03 – 2014-07-04 (×4): 600 mg via ORAL
  Filled 2014-07-03 (×4): qty 2

## 2014-07-03 MED ORDER — METHADONE HCL 10 MG PO TABS
10.0000 mg | ORAL_TABLET | Freq: Three times a day (TID) | ORAL | Status: DC
  Administered 2014-07-03 – 2014-07-04 (×4): 10 mg via ORAL
  Filled 2014-07-03 (×4): qty 1

## 2014-07-03 NOTE — Progress Notes (Signed)
[facility] PROGRESS NOTE  Stacey Murphy JTT:[id number] DOB: 1990-10-02 DOA: 06/30/2014 PCP: Duard Larsen, MD  Assessment/Plan: Principal Problem:  Intractable pain: related to metastatic sarcoma. Continues with hip pain this am. Required increase in dilaudid gtt this am. Home pain medications include fentanyl patch, gabapentin, lidocaine methadone and morphine pump. Will resume all but morphine. Will decrease dilaudid dose to 3mg /hr. Chart review indicates pain management challenge historically. Has decided to stop cabozantinib. Hospice care consult requested for pain management and comfort care at home. Discussed case with Dr Lin Landsman at [facility] on 07/02/14 who reported  Dr Tommi Rumps supportive of decision to stop cabozantinib. Of note, his note dated 06/24/14 indicates he has discussed Hospice with family and Dr. Sonny Dandy.  Active Problems:  Comfort measures only status: per patient. 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 has been treated at [facility] with chemo and radiation. Radiation stopped. Comfort care as above  Sinus tachycardia: chart review indicates chronic.     Code Status: DNR Family Communication: mother at bedside Disposition Plan: home hopefully tomorrow with Hospice   Consultants:  none  Procedures:  none  Antibiotics:  none  HPI/Subjective: Somewhat lethargic but easily arouses. Reports continued pain left hip.   Objective: Filed Vitals:   07/03/14 0649  BP: 94/52  Pulse: 110  Temp: 97.9 F (36.6 C)  Resp: 16    Intake/Output Summary (Last 24 hours) at 07/03/14 1216 Last data filed at 07/03/14 0515  Gross per 24 hour  Intake 623.63 ml  Output    475 ml  Net 148.63 ml   Filed Weights   07/01/14 1509  Weight: 53.3 kg (117 lb 8.1 oz)    Exam:   General:  Appears comfortable  Cardiovascular: tachycardic but regular. No MGR 1-2+ LE edema  Respiratory: normal effort, somewhat shallow BS clear bilaterally no  wheeze  Abdomen: soft +BS non-distended non-tender  Musculoskeletal: no clubbing or cyanosis   Data Reviewed: Basic Metabolic Panel: No results for input(s): NA, K, CL, CO2, GLUCOSE, BUN, CREATININE, CALCIUM, MG, PHOS in the last 168 hours. Liver Function Tests: No results for input(s): AST, ALT, ALKPHOS, BILITOT, PROT, ALBUMIN in the last 168 hours. No results for input(s): LIPASE, AMYLASE in the last 168 hours. No results for input(s): AMMONIA in the last 168 hours. CBC: No results for input(s): WBC, NEUTROABS, HGB, HCT, MCV, PLT in the last 168 hours. Cardiac Enzymes: No results for input(s): CKTOTAL, CKMB, CKMBINDEX, TROPONINI in the last 168 hours. BNP (last 3 results) No results for input(s): PROBNP in the last 8760 hours. CBG: No results for input(s): GLUCAP in the last 168 hours.  No results found for this or any previous visit (from the past 240 hour(s)).   Studies: No results found.  Scheduled Meds: . celecoxib  100 mg Oral BID  . DULoxetine  30 mg Oral Daily  . fentaNYL  300 mcg Transdermal Q72H  . gabapentin  600 mg Oral TID  . lidocaine  1 patch Transdermal Q24H  . megestrol  100 mg Oral QODAY  . methadone  10 mg Oral 3 times per day  . oxyCODONE  15 mg Oral Q4H  . pantoprazole  40 mg Oral Daily  . rivaroxaban  10 mg Oral BID   Continuous Infusions: . sodium chloride 50 mL/hr at 07/02/14 2134  . HYDROmorphone (DILAUDID) 2 mg/ml infusion - Palliative Care 5.4 mg/hr (07/03/14 0901)    Principal Problem:   Intractable pain Active Problems:   Comfort  measures only status   Metastatic sarcoma   Right hemiparesis   Sinus tachycardia    Time spent: 35 minutes    [facility] Pager [phone]. If 7PM-7AM, please contact night-coverage at [email or web address], password [facility] 07/03/2014, 12:16 PM  LOS: 3 days

## 2014-07-03 NOTE — Plan of Care (Signed)
Problem: Problem: Pain Management Progression Goal: Pain controlled Outcome: Progressing Goal: Pain Controlled (History of Chronic Pain) Outcome: Progressing

## 2014-07-04 MED ORDER — FUROSEMIDE 20 MG PO TABS
20.0000 mg | ORAL_TABLET | Freq: Every day | ORAL | Status: DC
  Administered 2014-07-04: 20 mg via ORAL
  Filled 2014-07-04: qty 1

## 2014-07-04 MED ORDER — SODIUM CHLORIDE 0.9 % IV SOLN: 0.2000 mg/h | INTRAVENOUS | Status: AC

## 2014-07-04 MED ORDER — DIPHENHYDRAMINE HCL 12.5 MG/5ML PO ELIX: 6.2500 mg | ORAL_SOLUTION | Freq: Four times a day (QID) | ORAL | Status: AC | PRN

## 2014-07-04 MED ORDER — FUROSEMIDE 20 MG PO TABS: 20.0000 mg | ORAL_TABLET | Freq: Every day | ORAL | Status: AC

## 2014-07-04 NOTE — Progress Notes (Signed)
Discharge instructions given, verbalized understanding, out in stable condition with saline lock intact to Rt. A/C, foley intact, oxygen at 2L/[place],  transported via stretcher by [facility].

## 2014-07-04 NOTE — Discharge Summary (Signed)
Physician Discharge Summary  Stacey Murphy YQM:[id number] DOB: May 30, 1991 DOA: 06/30/2014  PCP: Duard Larsen, MD  Admit date: 06/30/2014 Discharge date: 07/04/2014  Time spent: 40 minutes  Recommendations for Outpatient Follow-up:  1. Being discharge to home with Hospice care for comfort care 2. [facility] to follow   Discharge Diagnoses:  Principal Problem:   Intractable pain Active Problems:   Comfort measures only status   Metastatic sarcoma   Right hemiparesis   Sinus tachycardia   Discharge Condition: stable  Diet recommendation: regular  Filed Weights   07/01/14 1509  Weight: 53.3 kg (117 lb 8.1 oz)    History of present illness:  This is an unfortunate 23 year old lady with a past medical history of metastatic sarcoma who presented to the ER on 06/30/14 complaining of bilateral thigh pain/hip pain associated with some chest pain. This started at 10 AM that morning. She had been treated for metastatic sarcoma at [facility] and was on high-dose analgesics. She no longer receiving radiation but was still taking chemotherapy pill. She was on a morphine pump at 18 mg an hour and has numerous meds and patches The patient desired comfort measures only and she did not want to be resuscitated in the event of a cardiorespiratory arrest. She had not been eating and drinking well over the previoous several days. She was admitted for pain control.  Hospital Course:  Principal Problem:  Intractable pain: related to metastatic sarcoma. Admitted to medical floor. Morphine gtt changed to Dilaudid gtt with good results.  Home pain medications include fentanyl patch, gabapentin, lidocaine methadone and morphine interthecal pump. These were resumed 07/03/14. Chart review indicated pain management challenge historically. She decided to stop cabozantinib and accept Hospice care for comfort only care.  Active Problems:  Comfort measures only status: per patient. 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 has been treated at [facility] with chemo and radiation. Radiation stopped. Comfort care as above  Sinus tachycardia: chart review indicates chronic.   Procedures:  none  Consultations:  none  Discharge Exam: Filed Vitals:   07/04/14 0600  BP: 100/55  Pulse: 117  Temp: 98.6 F (37 C)  Resp: 18    General: eating appears comfortable but somewhat lethargic Cardiovascular: RRR no m/g/r 2+ LE edema Respiratory: normal effort slightly shallow BS no wheeze   Discharge Instructions You were cared for by a hospitalist during your hospital stay. If you have any questions about your discharge medications or the care you received while you were in the hospital after you are discharged, you can call the unit and asked to speak with the hospitalist on call if the hospitalist that took care of you is not available. Once you are discharged, your primary care physician will handle any further medical issues. Please note that NO REFILLS for any discharge medications will be authorized once you are discharged, as it is imperative that you return to your primary care physician (or establish a relationship with a primary care physician if you do not have one) for your aftercare needs so that they can reassess your need for medications and monitor your lab values.   Current Discharge Medication List    START taking these medications   Details  diphenhydrAMINE (BENADRYL) 12.5 MG/5ML elixir Take 2.5 mLs (6.25 mg total) by mouth every 6 (six) hours as needed for itching. Qty: 120 mL, Refills: 0    furosemide (LASIX) 20 MG tablet Take 1 tablet (20  mg total) by mouth daily. As needed for edema Qty: 30 tablet, Refills: 0    HYDROmorphone 100 mg in sodium chloride 0.9 % 40 mL Inject 0.2-10 mg/hr into the vein continuous. Qty: 1 Dose, Refills: 0      CONTINUE these medications which have NOT CHANGED   Details  albuterol (VENTOLIN HFA) 108 (90  BASE) MCG/ACT inhaler Inhale 2 puffs into the lungs every 6 (six) hours as needed. For shortness of breath/cough    carboxymethylcellulose (REFRESH PLUS) 0.5 % SOLN Apply 1 drop to eye 4 (four) times daily as needed (FOR DRY EYE RELIEF).     celecoxib (CELEBREX) 100 MG capsule Take 100 mg by mouth 2 (two) times daily.     docusate sodium (COLACE) 100 MG capsule Take 100 mg by mouth daily as needed for constipation.    DULoxetine (CYMBALTA) 30 MG capsule Take 30 mg by mouth daily.    fentaNYL (DURAGESIC - DOSED MCG/HR) 100 MCG/HR Place 300 mcg onto the skin every 3 (three) days.    gabapentin (NEURONTIN) 600 MG tablet Take 600 mg by mouth 3 (three) times daily.    Ketamine HCl POWD Place 2 sprays into the nose every hour as needed (Pain).     lidocaine (LIDODERM) 5 % Place 1 patch onto the skin daily. Remove & Discard patch within 12 hours or as directed by MD    megestrol (MEGACE) 40 MG/ML suspension Take 100 mg by mouth every other day.     methadone (DOLOPHINE) 10 MG tablet Take 10 mg by mouth every 8 (eight) hours.    omeprazole (PRILOSEC) 20 MG capsule Take 20 mg by mouth daily.    ondansetron (ZOFRAN) 8 MG tablet Take 8 mg by mouth every 8 (eight) hours as needed for nausea or vomiting.     oxyCODONE (ROXICODONE) 15 MG immediate release tablet Take 45 mg by mouth every 4 (four) hours as needed for pain.    rivaroxaban (XARELTO) 10 MG TABS tablet Take 10 mg by mouth 2 (two) times daily.      STOP taking these medications     cabozantinib s-malate (COMETRIQ) capsule (3 x $Re'20mg'RdR$  kit)      sodium chloride 0.9 % SOLN 50 mL with morphine 15 MG/ML SOLN 2 mg/mL        Allergies  Allergen Reactions  . Oxycodone Hives and Itching    Unlikely allergy. Pt states this reaction ONLY with oxyCONTIN, not oxycodone. See 03/20/13 note.  . Poractant Alfa Swelling    Patient states her face swells up when she eats sausage only, no other pork   . Pork-Derived Products Swelling    Patient  states her face swells up when she eats sausage only, no other pork       The results of significant diagnostics from this hospitalization (including imaging, microbiology, ancillary and laboratory) are listed below for reference.    Significant Diagnostic Studies: No results found.  Microbiology: No results found for this or any previous visit (from the past 240 hour(s)).   Labs: Basic Metabolic Panel: No results for input(s): NA, K, CL, CO2, GLUCOSE, BUN, CREATININE, CALCIUM, MG, PHOS in the last 168 hours. Liver Function Tests: No results for input(s): AST, ALT, ALKPHOS, BILITOT, PROT, ALBUMIN in the last 168 hours. No results for input(s): LIPASE, AMYLASE in the last 168 hours. No results for input(s): AMMONIA in the last 168 hours. CBC: No results for input(s): WBC, NEUTROABS, HGB, HCT, MCV, PLT in the last 168 hours.  Cardiac Enzymes: No results for input(s): CKTOTAL, CKMB, CKMBINDEX, TROPONINI in the last 168 hours. BNP: BNP (last 3 results) No results for input(s): PROBNP in the last 8760 hours. CBG: No results for input(s): GLUCAP in the last 168 hours.     SignedRadene Gunning  [facility] 07/04/2014, 11:54 AM

## 2014-07-04 NOTE — Progress Notes (Signed)
Dilaudid drip d/c due to patient d/c home, wasted 45cc in sink and witnessed by Ophelia Shoulder, RN.

## 2014-07-07 NOTE — Progress Notes (Signed)
UR chart review completed.  

## 2014-07-08 ENCOUNTER — Encounter (HOSPITAL_COMMUNITY): Payer: Self-pay | Admitting: Emergency Medicine

## 2014-07-08 ENCOUNTER — Emergency Department (HOSPITAL_COMMUNITY): Payer: Medicaid Other

## 2014-07-08 ENCOUNTER — Emergency Department (HOSPITAL_COMMUNITY)
Admission: EM | Admit: 2014-07-08 | Discharge: 2014-07-25 | Disposition: E | Payer: Medicaid Other | Attending: Emergency Medicine | Admitting: Emergency Medicine

## 2014-07-08 DIAGNOSIS — I469 Cardiac arrest, cause unspecified: Secondary | ICD-10-CM | POA: Diagnosis present

## 2014-07-08 DIAGNOSIS — R4182 Altered mental status, unspecified: Secondary | ICD-10-CM | POA: Diagnosis not present

## 2014-07-08 DIAGNOSIS — Z79899 Other long term (current) drug therapy: Secondary | ICD-10-CM | POA: Diagnosis not present

## 2014-07-08 DIAGNOSIS — G8929 Other chronic pain: Secondary | ICD-10-CM | POA: Diagnosis not present

## 2014-07-08 DIAGNOSIS — Z8781 Personal history of (healed) traumatic fracture: Secondary | ICD-10-CM | POA: Diagnosis not present

## 2014-07-08 DIAGNOSIS — Z86718 Personal history of other venous thrombosis and embolism: Secondary | ICD-10-CM | POA: Diagnosis not present

## 2014-07-08 DIAGNOSIS — J9601 Acute respiratory failure with hypoxia: Secondary | ICD-10-CM | POA: Insufficient documentation

## 2014-07-08 DIAGNOSIS — C7951 Secondary malignant neoplasm of bone: Secondary | ICD-10-CM | POA: Diagnosis not present

## 2014-07-08 DIAGNOSIS — C799 Secondary malignant neoplasm of unspecified site: Secondary | ICD-10-CM

## 2014-07-08 DIAGNOSIS — Z7901 Long term (current) use of anticoagulants: Secondary | ICD-10-CM | POA: Diagnosis not present

## 2014-07-08 LAB — BLOOD GAS, ARTERIAL
Acid-base deficit: 6.8 mmol/L — ABNORMAL HIGH (ref 0.0–2.0)
Bicarbonate: 18.1 mEq/L — ABNORMAL LOW (ref 20.0–24.0)
DRAWN BY: 38235
FIO2: 1 %
LHR: 16 {breaths}/min
MECHVT: 400 mL
O2 SAT: 99.6 %
PATIENT TEMPERATURE: 37
PEEP/CPAP: 5 cmH2O
TCO2: 18.1 mmol/L (ref 0–100)
pCO2 arterial: 35.7 mmHg (ref 35.0–45.0)
pH, Arterial: 7.325 — ABNORMAL LOW (ref 7.350–7.450)
pO2, Arterial: 268 mmHg — ABNORMAL HIGH (ref 80.0–100.0)

## 2014-07-08 NOTE — ED Notes (Signed)
Per EMS: pt was unresponsive on scene. Attempted to place king airway, pt vomited and aspirated and became responsive. Began transporting pt. Pt became unresponsive with no pulses roughly 3 min PTA at 20:47.

## 2014-07-08 NOTE — ED Notes (Signed)
Per EMS: pt had MOST form that stated pt was a DNR. Family retracted MOST form at the scene so CPR was initiated. EMS requested the MOST form but was told the father had destroyed the form.

## 2014-07-08 NOTE — ED Notes (Signed)
Blood pressure reading 69/39. Dr. Christy Gentles notified.

## 2014-07-08 NOTE — ED Notes (Signed)
Dr. Christy Gentles at bedside speaking with family. Pulses checked by this nurse and Dr. Christy Gentles. Weak femoral and carotid pulses present.

## 2014-07-08 NOTE — ED Provider Notes (Signed)
CSN: [id number]     Arrival date & time 07/14/2014  2049 History   First MD Initiated Contact with Patient 06/30/2014 2057     Chief Complaint  Patient presents with  . Cardiac Arrest   LEVEL 5 CAVEAT DUE TO UNRESPONSIVE  Patient is a 23 y.o. female presenting with altered mental status. The history is provided by the EMS personnel. The history is limited by the condition of the patient.  Altered Mental Status Presenting symptoms: unresponsiveness   Severity:  Severe Progression:  Worsening Chronicity:  New Patient presents from home via EMS They were called due to shortness of breath On arrival they reports pt was short of breath and they attempted to place [facility].  She woke up and began to speak to them.  Soon after she became unresponsive and became pulseless about 3 minutes prior to arrival. CPR was started.  No medications given and she was not intubated  EMS reports patient has h/o cancer, they report she was DNR but then "family destroyed the paperwork"    Past Medical History  Diagnosis Date  . Metastatic sarcoma   . Alveolar soft part sarcoma   . Chronic pain due to neoplasm   . Chronic back pain   . Chronic left hip pain   . Right leg DVT   . Pathological fracture of right femur   . Pathological fracture of right humerus   . Right hemiparesis   . Sinus tachycardia     chronic   Past Surgical History  Procedure Laterality Date  . Portacath placement    . Pain pump implantation    . Hip surgery Bilateral    No family history on file. History  Substance Use Topics  . Smoking status: Never Smoker   . Smokeless tobacco: Not on file  . Alcohol Use: No   OB History    No data available     Review of Systems  Unable to perform ROS: Patient unresponsive      Allergies  Oxycodone; Poractant alfa; and Pork-derived products  Home Medications   Prior to Admission medications   Medication Sig Start Date End Date Taking? Authorizing Provider  albuterol  (VENTOLIN HFA) 108 (90 BASE) MCG/ACT inhaler Inhale 2 puffs into the lungs every 6 (six) hours as needed. For shortness of breath/cough    Historical Provider, MD  carboxymethylcellulose (REFRESH PLUS) 0.5 % SOLN Apply 1 drop to eye 4 (four) times daily as needed (FOR DRY EYE RELIEF).     Historical Provider, MD  celecoxib (CELEBREX) 100 MG capsule Take 100 mg by mouth 2 (two) times daily.     Historical Provider, MD  diphenhydrAMINE (BENADRYL) 12.5 MG/5ML elixir Take 2.5 mLs (6.25 mg total) by mouth every 6 (six) hours as needed for itching. 07/04/14   Radene Gunning, NP  docusate sodium (COLACE) 100 MG capsule Take 100 mg by mouth daily as needed for constipation.    Historical Provider, MD  DULoxetine (CYMBALTA) 30 MG capsule Take 30 mg by mouth daily. 08/12/13 08/12/14  Historical Provider, MD  fentaNYL (DURAGESIC - DOSED MCG/HR) 100 MCG/HR Place 300 mcg onto the skin every 3 (three) days.    Historical Provider, MD  furosemide (LASIX) 20 MG tablet Take 1 tablet (20 mg total) by mouth daily. As needed for edema 07/04/14   Radene Gunning, NP  gabapentin (NEURONTIN) 600 MG tablet Take 600 mg by mouth 3 (three) times daily.    Historical Provider, MD  HYDROmorphone 100  mg in sodium chloride 0.9 % 40 mL Inject 0.2-10 mg/hr into the vein continuous. 07/04/14   Radene Gunning, NP  Ketamine HCl POWD Place 2 sprays into the nose every hour as needed (Pain).  05/15/14 05/15/15  Historical Provider, MD  lidocaine (LIDODERM) 5 % Place 1 patch onto the skin daily. Remove & Discard patch within 12 hours or as directed by MD    Historical Provider, MD  megestrol (MEGACE) 40 MG/ML suspension Take 100 mg by mouth every other day.     Historical Provider, MD  methadone (DOLOPHINE) 10 MG tablet Take 10 mg by mouth every 8 (eight) hours.    Historical Provider, MD  omeprazole (PRILOSEC) 20 MG capsule Take 20 mg by mouth daily.    Historical Provider, MD  ondansetron (ZOFRAN) 8 MG tablet Take 8 mg by mouth every 8 (eight)  hours as needed for nausea or vomiting.     Historical Provider, MD  oxyCODONE (ROXICODONE) 15 MG immediate release tablet Take 45 mg by mouth every 4 (four) hours as needed for pain.    Historical Provider, MD  rivaroxaban (XARELTO) 10 MG TABS tablet Take 10 mg by mouth 2 (two) times daily.    Historical Provider, MD    Physical Exam CONSTITUTIONAL: unresponsive HEAD: Normocephalic EYES: pupils dilated bilaterally ENMT: copious secretions in he mouth NECK: supple no meningeal signs Chest - port in place CV: no spontaneous cardiac activity LUNGS: equal breath sounds with bagging ABDOMEN: soft DJ:SHFWY catheter in place on arrival NEURO: Pt is unresponsive without spontaneous neurologic activity   EXTREMITIES: pitting edema noted to bilateral LE SKIN: warm, sacral decubitus ulcers noted.  Fentanyl patches to left arm PSYCH: unable to assess  ED Course  IO LINE INSERTION Date/Time: 07/23/2014 8:59 PM Performed by: Sharyon Cable Authorized by: Sharyon Cable Consent: The procedure was performed in an emergent situation. Insertion site: right proximal tibia Site preparation: alcohol Insertion: needle was inserted through the bony cortex Number of attempts: 1 Confirmation method: stability of the needle, easy infusion of fluids and aspiration of blood/marrow Patient tolerance: Patient tolerated the procedure well with no immediate complications     INTUBATION Performed by: Sharyon Cable  Required items: requireddevices, and special equipment available Patient identity confirmed: provided demographic data and hospital-assigned identification number  Indications: unresponsive  Intubation method: Glidescope Laryngoscopy   Preoxygenation: BVM   Tube Size: 7.0 cuffed  Post-procedure assessment: chest rise and ETCO2 monitor Breath sounds: equal and absent over the epigastrium  Chest x-ray findings: endotracheal tube in appropriate position  Patient tolerated  the procedure well with no immediate complications.    CPR Procedure Note I PERSONALLY DIRECTED ANCILLARY STAFF OR/PERFORMED CPR IN AN EFFORT TO REGAIN RETURN OF SPONTANEOUS CIRCULATION IN AN EFFORT TO MAINTAIN NEURO, CARDIAC AND SYSTEMIC PERFUSION   Labs Review Labs Reviewed  BLOOD GAS, ARTERIAL - Abnormal; Notable for the following:    pH, Arterial 7.325 (*)    pO2, Arterial 268.0 (*)    Bicarbonate 18.1 (*)    Acid-base deficit 6.8 (*)    All other components within normal limits  I-STAT CHEM 8, ED    Imaging Review Dg Chest Portable 1 View  07/06/2014   CLINICAL DATA:  Altered mental status.  EXAM: PORTABLE CHEST - 1 VIEW  COMPARISON:  January 13, 2014.  FINDINGS: Stable cardiomediastinal silhouette. Interval placement of endotracheal tube with distal tip 1.4 cm above the carina. Nasogastric tube is seen entering stomach. Stable left subclavian Port-A-Cath with  tip overlying expected position of the SVC. Bilateral pulmonary nodules are noted which appear to have increased significantly since prior exam. The largest is noted in the left lung apex. Central pulmonary vascular congestion is noted with possible bilateral perihilar edema. No pneumothorax or significant pleural effusion is noted. Rounded lucency is noted in the proximal humerus concerning for metastatic disease.  IMPRESSION: Endotracheal tube in grossly good position. Bilateral pulmonary nodules are noted which appear to have significantly increased in size compared to prior exam consistent with worsening metastatic disease. Central pulmonary vascular congestion is noted with possible bilateral perihilar edema.   Electronically Signed   By: Sabino Dick M.D.   On: 07/11/2014 21:47     EKG Interpretation   Date/Time:  Tuesday July 08 2014 21:13:17 EST Ventricular Rate:  134 PR Interval:  94 QRS Duration: 107 QT Interval:  416 QTC Calculation: 621 R Axis:   -21 Text Interpretation:  Sinus tachycardia Probable left  ventricular  hypertrophy Repol abnrm suggests ischemia, diffuse leads Prolonged QT  interval Confirmed by Christy Gentles  MD, Elenore Rota ([id number]) on 07/02/2014 9:26:19  PM      ED COURSE 9:43 PM  Patient seen on arrival with CPR in progress.  EMS reports she has "MOST" form that family destroyed, therefore CPR initiated.  On arrival, she was in apparent PEA, copious secretions in mouth.  Decision to intubate as unclear code status.  She had narcotic pain pump in place and this was stopped.  Fentanyl patches removed.  Soon after intubation and chest compression, patient regained pulses.  Patient was placed on ventilator.  I was then able to review chart and on 12/7 she had ER visit.  She has h/o metastatic sarcoma.  She had reported to ER physician at that time she was DNR.  See discharge summary below that also confirms code status as DNR   REVIEW OF CHART AFTER RESUSCITATION, FROM DISCHARGE SUMMARY FROM December 11: "History of present illness:  This is an unfortunate 23 year old lady with a past medical history of metastatic sarcoma who presented to the ER on 06/30/14 complaining of bilateral thigh pain/hip pain associated with some chest pain. This started at 10 AM that morning. She had been treated for metastatic sarcoma at [facility] and was on high-dose analgesics. She no longer receiving radiation but was still taking chemotherapy pill. She was on a morphine pump at 18 mg an hour and has numerous meds and patches The patient desired comfort measures only and she did not want to be resuscitated in the event of a cardiorespiratory arrest. She had not been eating and drinking well over the previoous several days. She was admitted for pain control.  Hospital Course:  Principal Problem:  Intractable pain: related to metastatic sarcoma. Admitted to medical floor. Morphine gtt changed to Dilaudid gtt with good results. Home pain medications include fentanyl patch, gabapentin, lidocaine methadone and  morphine interthecal pump. These were resumed 07/03/14. Chart review indicated pain management challenge historically. She decided to stop cabozantinib and accept Hospice care for comfort only care."  10:09 PM I spoke to mother and father They confirm that she is a DNR and no further resuscitation They will come back to room and see her  Her hypotension is worsening I also spoke to Dr Ernestina Patches with [facility] about possible admission  10:27pm Patient began to deteriorate with bradycardia/hypotension Patient became apneic and pulseless Time of death at 10:27pm I spoke to mother/father with nurse present  MDM   Final diagnoses:  Altered mental status  Metastatic carcinoma  Acute respiratory failure with hypoxia    Nursing notes including past medical history and social history reviewed and considered in documentation xrays/imaging reviewed by myself and considered during evaluation Previous records reviewed and considered Labs/vital reviewed myself and considered during evaluation     Sharyon Cable, MD 07/20/2014 2236

## 2014-07-08 NOTE — ED Notes (Signed)
PEA on monitor, no pulses palpable. Dr. Christy Gentles at bedside confirmed no pulses. Time of death October 25, 2225.

## 2014-07-09 MED FILL — Medication: Qty: 1 | Status: AC

## 2014-07-25 NOTE — ED Notes (Signed)
Dilaudid from patient's pain pump wasted as directed by hospice and witnessed by Myer Peer, RN. 333.7 mL was left in bag at time of waste.

## 2014-07-25 DEATH — deceased
